# Patient Record
Sex: Female | Born: 1980 | Race: Black or African American | Hispanic: No | Marital: Married | Smoking: Never smoker
Health system: Southern US, Community
[De-identification: ages and names within clinical notes are randomized; demographics above are authoritative.]

## PROBLEM LIST (undated history)

## (undated) DIAGNOSIS — R87619 Unspecified abnormal cytological findings in specimens from cervix uteri: Secondary | ICD-10-CM

## (undated) HISTORY — DX: Unspecified abnormal cytological findings in specimens from cervix uteri: R87.619

---

## 2018-10-21 ENCOUNTER — Other Ambulatory Visit: Payer: Self-pay

## 2018-10-21 ENCOUNTER — Ambulatory Visit: Payer: Self-pay | Admitting: Physician Assistant

## 2018-10-21 VITALS — BP 111/69 | HR 78 | Temp 98.7°F | Resp 16 | Wt 213.2 lb

## 2018-10-21 DIAGNOSIS — H6691 Otitis media, unspecified, right ear: Secondary | ICD-10-CM

## 2018-10-21 DIAGNOSIS — J0100 Acute maxillary sinusitis, unspecified: Secondary | ICD-10-CM

## 2018-10-21 DIAGNOSIS — R0981 Nasal congestion: Secondary | ICD-10-CM

## 2018-10-21 MED ORDER — FLUTICASONE PROPIONATE 50 MCG/ACT NA SUSP: 2.0000 | Freq: Every day | NASAL | 0 refills | Status: AC

## 2018-10-21 MED ORDER — DOXYCYCLINE HYCLATE 100 MG PO TABS: 100.0000 mg | ORAL_TABLET | Freq: Two times a day (BID) | ORAL | 0 refills | Status: AC

## 2018-10-21 NOTE — Progress Notes (Signed)
Patient ID: Daisy Mcknight DOB: 08/30/1980 AGE: 38 y.o. MRN: [id number]   PCP: No primary care provider on file.   Chief Complaint:  Chief Complaint  Patient presents with  . Ear Pain    1 week, right side  . Eye Pain    1 week, right eye  . Sinus Problem    1 week, right side pressure     Subjective:    HPI:  Daisy Mcknight is a 38 y.o. female presents for evaluation  Chief Complaint  Patient presents with  . Ear Pain    1 week, right side  . Eye Pain    1 week, right eye  . Sinus Problem    1 week, right side pressure    38 year old female presents to [facility] with one week history of right ear pain, right eye pain, right sided facial pain, and right jaw pain. Began with postnasal drip and scratchy throat. Associated nasal congestion; has since resolved. Possible sweats/chills initially, have since resolved. Then developed right ear pain. Describes as a deep ache/pressure. Denies popping/crackling. Denies change in hearing. Denies ear discharge/drainage. No recent swimming. Associated right sided facial pain, tender to touch along right maxillary sinus, especially closer to nose. Associated right jaw pain, locates at right ramus. Has been using OTC zyrtec and ibuprofen with minimal improvement. Known seasonal allergies; worse in fall. Was evaluated by eye doctor (told no cause for eye pain, given new rx) and dentist (no cause for dental/jaw pain). Both professionals stated they suspected she may have sinusitis and need an antibiotic. Denies eye injury/trauma. Denies fever, headache, dizziness/lightheadedness, change in vision, eye discharge/drainage, epistaxis, sore throat, cough, chest pain, SOB, wheezing, nausea/vomiting. 57 year old son with similar symptoms. Denies known Covid19 exposure; declines testing for her or her son.  A limited review of symptoms was performed, pertinent positives and negatives as mentioned in HPI.  The following portions of the patient's  history were reviewed and updated as appropriate: allergies, current medications and past medical history.  There are no active problems to display for this patient.   Not on File  Current Outpatient Medications on File Prior to Visit  Medication Sig Dispense Refill  . cetirizine (ZYRTEC) 10 MG tablet Take 10 mg by mouth daily.     No current facility-administered medications on file prior to visit.        Objective:   Vitals:   10/21/18 1215  BP: 111/69  Pulse: 78  Resp: 16  Temp: 98.7 F (37.1 C)  SpO2: 96%     Wt Readings from Last 3 Encounters:  10/21/18 213 lb 3.2 oz (96.7 kg)    Physical Exam:   General Appearance:  Patient sitting comfortably on examination table. Conversational. Kermit Balo self-historian. In no acute distress. Afebrile.   Head:  Normocephalic, without obvious abnormality, atraumatic  Eyes:  PERRL, conjunctiva/corneas clear, EOM's intact Right conjunctiva with no injection or erythema. No discharge/drainage. No chemosis. No perilimbal flushing. Cornea normal in appearance. No eyelid erythema, edema or evidence of sty. No periorbital edema or erythema. No tenderness with palpation over eye or periorbital area.  Ears:  Left ear canal WNL. No erythema or edema. No open wound. No visible purulent drainage. No tenderness with palpation over left tragus or with manipulation of left auricle. No visible erythema or edema of left mastoid. No tenderness with palpation over left mastoid. Right ear canal WNL. No erythema or edema. No open wound. No visible purulent drainage. No tenderness with  palpation over right tragus or with manipulation of right auricle. No visible erythema or edema of right mastoid. No tenderness with palpation over right mastoid. Left TM WNL. Good light reflex. Visible landmarks. No erythema. No injection. No bulging or retraction. No visible perforation. No serous effusion. No visible purulent effusion. No tympanostomy tube. No scar  tissue. Right TM: Diffuse faint erythema. Minimal bulging; mild serous effusion, appears clear, no cloudy/purulent appearance. Minimal injection. No scar tissue. No visible aperture. No discharge/drainage.  Nose: Nares normal. Septum midline. No visible polyps. No discharge. Normal mucosa. No significant edema. No visible rhinorrhea/discharge. Moderate tenderness with palpation over right maxillary sinus, more severe adjacent to nose.  Throat: Lips, mucosa, and tongue normal; teeth and gums normal. Throat reveals no erythema. No postnasal drip. No visible cobblestoning. Tonsils with no enlargement or exudate. Uvula midline with no edema or erythema.  Neck: Supple, symmetrical, trachea midline, no adenopathy  Lungs:   Clear to auscultation bilaterally, respirations unlabored. Good aeration. No rales, rhonchi, crackles or wheezing.  Heart:  Regular rate and rhythm, S1 and S2 normal, no murmur, rub, or gallop  Extremities: Extremities normal, atraumatic, no cyanosis or edema  Pulses: 2+ and symmetric  Skin: Skin color, texture, turgor normal, no rashes or lesions  Lymph nodes: Cervical, supraclavicular, and axillary nodes normal  Neurologic: Normal    Assessment & Plan:    Exam findings, diagnosis etiology and medication use and indications reviewed with patient. Follow-Up and discharge instructions provided. No emergent/urgent issues found on exam.  Patient education was provided.   Patient verbalized understanding of information provided and agrees with plan of care (POC), all questions answered. The patient is advised to call or return to clinic if condition does not see an improvement in symptoms, or to seek the care of the closest emergency department if condition worsens with the below plan.    1. Acute non-recurrent maxillary sinusitis - doxycycline (VIBRA-TABS) 100 MG tablet; Take 1 tablet (100 mg total) by mouth 2 (two) times daily for 7 days.  Dispense: 14 tablet; Refill: 0 -  fluticasone (FLONASE) 50 MCG/ACT nasal spray; Place 2 sprays into both nostrils daily.  Dispense: 16 g; Refill: 0  2. Nasal congestion  3. Acute right otitis media  38 year old female presents with one week history of right ear pain. Associated right eye pain, right maxillary pain, and right jaw pain. Precipitated by allergy/URI symptoms. Evaluated by optometrist and dentist; no visible cause for symptoms. VSS, afebrile, in no acute distress, erythematous TM on physical exam. At this time, will treat for acute sinusitis and right otitis media. Prescribed Doxycycline 100mg  bid x 7 days and Flonase nasal spray. Advised saline nasal rinse and/or NettiPot. Advised Tylenol or ibuprofen for pain/discomfort. Advised follow-up with PCP, urgent care, or ED in 2-3 days if symptoms not improving. Advised sooner follow-up with worsening symptoms or any new/concerning symptoms. Patient agreed with plan.   Janalyn HarderSamantha Brinleigh Tew, MHS, PA-C Rulon SeraSamantha F. Jarika Robben, MHS, PA-C [facility]  [facility]  [phone] [place], [place] [address] (p): [phone] [email or web address]

## 2018-10-21 NOTE — Patient Instructions (Addendum)
Thank you for choosing [facility] for your health care needs.  You have been diagnosed with: 1. Acute non-recurrent maxillary sinusitis - doxycycline (VIBRA-TABS) 100 MG tablet; Take 1 tablet (100 mg total) by mouth 2 (two) times daily for 7 days.  Dispense: 14 tablet; Refill: 0 - fluticasone (FLONASE) 50 MCG/ACT nasal spray; Place 2 sprays into both nostrils daily.  Dispense: 16 g; Refill: 0  2. Nasal congestion  3. Acute right otitis media  Take medications as prescribed.  May continue to use over the counter Zyrtec (ceterizine).  Recommend using saline nasal spray and/or NettiPot. May use over the counter Tylenol or ibuprofen for pain/discomfort.  Follow-up with family physician, urgent care, or ED in 2-3 days if symptoms not improving. Follow-up sooner with any worsening symptoms such as fever, chills, body aches, severe headache, ear discharge/drainage, loss of hearing, nausea/vomiting, or other new/concerning symptom.  Hope you feel better soon!   Sinusitis, Adult Sinusitis is soreness and swelling (inflammation) of your sinuses. Sinuses are hollow spaces in the bones around your face. They are located:  Around your eyes.  In the middle of your forehead.  Behind your nose.  In your cheekbones. Your sinuses and nasal passages are lined with a fluid called mucus. Mucus drains out of your sinuses. Swelling can trap mucus in your sinuses. This lets germs (bacteria, virus, or fungus) grow, which leads to infection. Most of the time, this condition is caused by a virus. What are the causes? This condition is caused by:  Allergies.  Asthma.  Germs.  Things that block your nose or sinuses.  Growths in the nose (nasal polyps).  Chemicals or irritants in the air.  Fungus (rare). What increases the risk? You are more likely to develop this condition if:  You have a weak body defense system (immune system).  You do a lot of swimming or diving.  You use  nasal sprays too much.  You smoke. What are the signs or symptoms? The main symptoms of this condition are pain and a feeling of pressure around the sinuses. Other symptoms include:  Stuffy nose (congestion).  Runny nose (drainage).  Swelling and warmth in the sinuses.  Headache.  Toothache.  A cough that may get worse at night.  Mucus that collects in the throat or the back of the nose (postnasal drip).  Being unable to smell and taste.  Being very tired (fatigue).  A fever.  Sore throat.  Bad breath. How is this diagnosed? This condition is diagnosed based on:  Your symptoms.  Your medical history.  A physical exam.  Tests to find out if your condition is short-term (acute) or long-term (chronic). Your doctor may: ? Check your nose for growths (polyps). ? Check your sinuses using a tool that has a light (endoscope). ? Check for allergies or germs. ? Do imaging tests, such as an MRI or CT scan. How is this treated? Treatment for this condition depends on the cause and whether it is short-term or long-term.  If caused by a virus, your symptoms should go away on their own within 10 days. You may be given medicines to relieve symptoms. They include: ? Medicines that shrink swollen tissue in the nose. ? Medicines that treat allergies (antihistamines). ? A spray that treats swelling of the nostrils. ? Rinses that help get rid of thick mucus in your nose (nasal saline washes).  If caused by bacteria, your doctor may wait to see if you will get better without treatment.  You may be given antibiotic medicine if you have: ? A very bad infection. ? A weak body defense system.  If caused by growths in the nose, you may need to have surgery. Follow these instructions at home: Medicines  Take, use, or apply over-the-counter and prescription medicines only as told by your doctor. These may include nasal sprays.  If you were prescribed an antibiotic medicine, take it  as told by your doctor. Do not stop taking the antibiotic even if you start to feel better. Hydrate and humidify   Drink enough water to keep your pee (urine) pale yellow.  Use a cool mist humidifier to keep the humidity level in your home above 50%.  Breathe in steam for 10-15 minutes, 3-4 times a day, or as told by your doctor. You can do this in the bathroom while a hot shower is running.  Try not to spend time in cool or dry air. Rest  Rest as much as you can.  Sleep with your head raised (elevated).  Make sure you get enough sleep each night. General instructions   Put a warm, moist washcloth on your face 3-4 times a day, or as often as told by your doctor. This will help with discomfort.  Wash your hands often with soap and water. If there is no soap and water, use hand sanitizer.  Do not smoke. Avoid being around people who are smoking (secondhand smoke).  Keep all follow-up visits as told by your doctor. This is important. Contact a doctor if:  You have a fever.  Your symptoms get worse.  Your symptoms do not get better within 10 days. Get help right away if:  You have a very bad headache.  You cannot stop throwing up (vomiting).  You have very bad pain or swelling around your face or eyes.  You have trouble seeing.  You feel confused.  Your neck is stiff.  You have trouble breathing. Summary  Sinusitis is swelling of your sinuses. Sinuses are hollow spaces in the bones around your face.  This condition is caused by tissues in your nose that become inflamed or swollen. This traps germs. These can lead to infection.  If you were prescribed an antibiotic medicine, take it as told by your doctor. Do not stop taking it even if you start to feel better.  Keep all follow-up visits as told by your doctor. This is important. This information is not intended to replace advice given to you by your health care provider. Make sure you discuss any questions you  have with your health care provider. Document Released: 07/29/2007 Document Revised: 07/12/2017 Document Reviewed: 07/12/2017 Elsevier Patient Education  2020 [employer].

## 2018-11-01 ENCOUNTER — Encounter: Payer: Self-pay | Admitting: Obstetrics & Gynecology

## 2018-11-01 ENCOUNTER — Other Ambulatory Visit: Payer: Self-pay

## 2018-11-01 ENCOUNTER — Ambulatory Visit (INDEPENDENT_AMBULATORY_CARE_PROVIDER_SITE_OTHER): Payer: BC Managed Care – PPO | Admitting: Obstetrics & Gynecology

## 2018-11-01 VITALS — BP 128/83 | HR 76 | Ht 65.0 in | Wt 207.0 lb

## 2018-11-01 DIAGNOSIS — Z01419 Encounter for gynecological examination (general) (routine) without abnormal findings: Secondary | ICD-10-CM

## 2018-11-01 DIAGNOSIS — Z30431 Encounter for routine checking of intrauterine contraceptive device: Secondary | ICD-10-CM

## 2018-11-01 DIAGNOSIS — Z1151 Encounter for screening for human papillomavirus (HPV): Secondary | ICD-10-CM

## 2018-11-01 DIAGNOSIS — Z124 Encounter for screening for malignant neoplasm of cervix: Secondary | ICD-10-CM | POA: Diagnosis not present

## 2018-11-01 NOTE — Patient Instructions (Signed)
Thank you for enrolling in [facility]. Please follow the instructions below to securely access your online medical record. MyChart allows you to send messages to your doctor, view your test results, manage appointments, and more.   How Do I Sign Up? 1. In your Internet browser, go to [employer] and enter [email or web address]. 2. Click on the Sign Up Now link in the Sign In box. You will see the New Member Sign Up page. 3. Enter your MyChart Access Code exactly as it appears below. You will not need to use this code after you've completed the sign-up process. If you do not sign up before the expiration date, you must request a new code.  MyChart Access Code: [email or web address] Expires: 12/04/2018  2:32 PM  4. Enter your Social Security Number ([id number]) and Date of Birth (mm/dd/yyyy) as indicated and click Submit. You will be taken to the next sign-up page. 5. Create a MyChart ID. This will be your MyChart login ID and cannot be changed, so think of one that is secure and easy to remember. 6. Create a MyChart password. You can change your password at any time. 7. Enter your Password Reset Question and Answer. This can be used at a later time if you forget your password.  8. Enter your e-mail address. You will receive e-mail notification when new information is available in [place]. 9. Click Sign Up. You can now view your medical record.   Additional Information Remember, MyChart is NOT to be used for urgent needs. For medical emergencies, dial 911.   Preventive Care 39-21 Years Old, Female Preventive care refers to visits with your health care provider and lifestyle choices that can promote health and wellness. This includes:  A yearly physical exam. This may also be called an annual well check.  Regular dental visits and eye exams.  Immunizations.  Screening for certain conditions.  Healthy lifestyle choices, such as eating a healthy diet, getting regular exercise, not  using drugs or products that contain nicotine and tobacco, and limiting alcohol use. What can I expect for my preventive care visit? Physical exam Your health care provider will check your:  Height and weight. This may be used to calculate body mass index (BMI), which tells if you are at a healthy weight.  Heart rate and blood pressure.  Skin for abnormal spots. Counseling Your health care provider may ask you questions about your:  Alcohol, tobacco, and drug use.  Emotional well-being.  Home and relationship well-being.  Sexual activity.  Eating habits.  Work and work Statistician.  Method of birth control.  Menstrual cycle.  Pregnancy history. What immunizations do I need?  Influenza (flu) vaccine  This is recommended every year. Tetanus, diphtheria, and pertussis (Tdap) vaccine  You may need a Td booster every 10 years. Varicella (chickenpox) vaccine  You may need this if you have not been vaccinated. Human papillomavirus (HPV) vaccine  If recommended by your health care provider, you may need three doses over 6 months. Measles, mumps, and rubella (MMR) vaccine  You may need at least one dose of MMR. You may also need a second dose. Meningococcal conjugate (MenACWY) vaccine  One dose is recommended if you are age 39-21 years and a first-year college student living in a residence hall, or if you have one of several medical conditions. You may also need additional booster doses. Pneumococcal conjugate (PCV13) vaccine  You may need this if you have certain conditions and were not previously vaccinated. Pneumococcal polysaccharide (PPSV23) vaccine  You may need one or two doses if you smoke cigarettes or if you have certain conditions. Hepatitis A vaccine  You may need this if you have certain conditions or if you travel or work in places where you may be exposed to hepatitis A. Hepatitis B vaccine  You may need this if you have certain conditions or if you  travel or work in places where you may be exposed to hepatitis B. Haemophilus influenzae type b (Hib) vaccine  You may need this if you have certain conditions. You may receive vaccines as individual doses or as more than one vaccine together in one shot (combination vaccines). Talk with your health care provider about the risks and benefits of combination vaccines. What tests do I need?  Blood tests  Lipid and cholesterol levels. These may be checked every 5 years starting at age 10.  Hepatitis C test.  Hepatitis B test. Screening  Diabetes screening. This is done by checking your blood sugar (glucose) after you have not eaten for a while (fasting).  Sexually transmitted disease (STD) testing.  BRCA-related cancer screening. This may be done if you have a family history of breast, ovarian, tubal, or peritoneal cancers.  Pelvic exam and Pap test. This may be done every 3 years starting at age 12. Starting at age 61, this may be done every 5 years if you have a Pap test in combination with an HPV test. Talk with your health care provider about your test results, treatment options, and if necessary, the need for more tests. Follow these instructions at home: Eating and drinking   Eat a diet that includes fresh fruits and vegetables, whole grains, lean protein, and low-fat dairy.  Take vitamin and mineral supplements as recommended by your health care provider.  Do not drink alcohol if: ? Your health care provider tells you not to drink. ? You are pregnant, may be pregnant, or are planning to become pregnant.  If you drink alcohol: ? Limit how much you have to 0-1 drink a day. ? Be aware of how much alcohol is in your drink. In the [place] one drink equals one 12 oz bottle of beer (355 mL), one 5 oz glass of wine (148 mL), or one 1 oz glass of hard liquor (44 mL). Lifestyle  Take daily care of your teeth and gums.  Stay active. Exercise for at least 30 minutes on 5 or more days  each week.  Do not use any products that contain nicotine or tobacco, such as cigarettes, e-cigarettes, and chewing tobacco. If you need help quitting, ask your health care provider.  If you are sexually active, practice safe sex. Use a condom or other form of birth control (contraception) in order to prevent pregnancy and STIs (sexually transmitted infections). If you plan to become pregnant, see your health care provider for a preconception visit. What's next?  Visit your health care provider once a year for a well check visit.  Ask your health care provider how often you should have your eyes and teeth checked.  Stay up to date on all vaccines. This information is not intended to replace advice given to you by your health care provider. Make sure you discuss any questions you have with your health care provider. Document Released: 04/07/2001 Document Revised: 10/21/2017 Document Reviewed: 10/21/2017 Elsevier Patient Education  2020 [employer].

## 2018-11-01 NOTE — Progress Notes (Signed)
GYNECOLOGY ANNUAL PREVENTATIVE CARE ENCOUNTER NOTE  History:     Daisy Mcknight is a 38 y.o. [id number] female here to establish care and for a routine annual gynecologic exam. She recently came from [place].  Current complaints: none. Had Mirena placed after her last birth in 09/2013, wants to know if she can replace her IUD. Denies abnormal vaginal bleeding, discharge, pelvic pain, problems with intercourse or other gynecologic concerns.    Gynecologic History Patient's last menstrual period was 10/18/2018. Contraception: Mirena IUD in 09/2013 Last Pap: 2019. Results were: normal with negative HPV.  Had an abnormal pap in 2004 followed by cryotherapy and normal paps.   Obstetric History OB History  Gravida Para Term Preterm AB Living  3 3 3     3   SAB TAB Ectopic Multiple Live Births          2    # Outcome Date GA Lbr Len/2nd Weight Sex Delivery Anes PTL Lv  3 Term 08/24/13 2074w0d   Judie PetitM Vag-Spont  Y   2 Term 05/21/07 1329w0d   F Vag-Spont   LIV  1 Term 10/31/99 3074w0d   F Vag-Spont  N LIV    Past Medical History:  Diagnosis Date  . Abnormal Pap smear of cervix    had cryo    History reviewed. No pertinent surgical history.  Current Outpatient Medications on File Prior to Visit  Medication Sig Dispense Refill  . levonorgestrel (MIRENA) 20 MCG/24HR IUD 1 each by Intrauterine route once.    . cetirizine (ZYRTEC) 10 MG tablet Take 10 mg by mouth daily.    . fluticasone (FLONASE) 50 MCG/ACT nasal spray Place 2 sprays into both nostrils daily. 16 g 0   No current facility-administered medications on file prior to visit.     Not on File  Social History:  reports that she has never smoked. She has never used smokeless tobacco. She reports previous alcohol use. She reports previous drug use.  History reviewed. No pertinent family history.  The following portions of the patient's history were reviewed and updated as appropriate: allergies, current medications, past family history, past  medical history, past social history, past surgical history and problem list.  Review of Systems Pertinent items noted in HPI and remainder of comprehensive ROS otherwise negative.  Physical Exam:  BP 128/83   Pulse 76   Ht 5\' 5"  (1.651 m)   Wt 207 lb (93.9 kg)   LMP 10/18/2018   BMI 34.45 kg/m  CONSTITUTIONAL: Well-developed, well-nourished female in no acute distress.  HENT:  Normocephalic, atraumatic, External right and left ear normal. Oropharynx is clear and moist EYES: Conjunctivae and EOM are normal. Pupils are equal, round, and reactive to light. No scleral icterus.  NECK: Normal range of motion, supple, no masses.  Normal thyroid.  SKIN: Skin is warm and dry. No rash noted. Not diaphoretic. No erythema. No pallor. MUSCULOSKELETAL: Normal range of motion. No tenderness.  No cyanosis, clubbing, or edema.  2+ distal pulses. NEUROLOGIC: Alert and oriented to person, place, and time. Normal reflexes, muscle tone coordination. No cranial nerve deficit noted. PSYCHIATRIC: Normal mood and affect. Normal behavior. Normal judgment and thought content. CARDIOVASCULAR: Normal heart rate noted, regular rhythm RESPIRATORY: Clear to auscultation bilaterally. Effort and breath sounds normal, no problems with respiration noted. BREASTS: Symmetric in size. No masses, skin changes, nipple drainage, or lymphadenopathy. ABDOMEN: Soft, normal bowel sounds, no distention noted.  No tenderness, rebound or guarding.  PELVIC: Normal appearing external genitalia; normal appearing  vaginal mucosa and cervix. IUD strings visualized.  No abnormal discharge noted.  Pap smear obtained.  Normal uterine size, no other palpable masses, no uterine or adnexal tenderness.   Assessment and Plan:      1. Well woman exam with routine gynecological exam - CBC - TSH - Hemoglobin A1c - Lipid panel - Comprehensive metabolic panel - Cytology - PAP - VITAMIN D 25 Hydroxy (Vit-D Deficiency, Fractures) Will follow up  results of pap smear and manage accordingly.  Annual labs also checked today.   2. IUD check up Recommended keeping IUD in place for seven years, no need to remove today.  Patient agreed with this plan. No other issues.   Routine preventative health maintenance measures emphasized. Please refer to After Visit Summary for other counseling recommendations.      Verita Schneiders, MD, [place] [facility], [place]

## 2018-11-02 ENCOUNTER — Telehealth: Payer: Self-pay | Admitting: *Deleted

## 2018-11-02 LAB — COMPREHENSIVE METABOLIC PANEL
ALT: 13 IU/L (ref 0–32)
AST: 13 IU/L (ref 0–40)
Albumin/Globulin Ratio: 1.6 (ref 1.2–2.2)
Albumin: 4.2 g/dL (ref 3.8–4.8)
Alkaline Phosphatase: 56 IU/L (ref 39–117)
BUN/Creatinine Ratio: 13 (ref 9–23)
BUN: 11 mg/dL (ref 6–20)
Bilirubin Total: 0.2 mg/dL (ref 0.0–1.2)
CO2: 23 mmol/L (ref 20–29)
Calcium: 10 mg/dL (ref 8.7–10.2)
Chloride: 103 mmol/L (ref 96–106)
Creatinine, Ser: 0.84 mg/dL (ref 0.57–1.00)
GFR calc Af Amer: 102 mL/min/{1.73_m2} (ref >59)
GFR calc non Af Amer: 88 mL/min/{1.73_m2} (ref >59)
Globulin, Total: 2.7 g/dL (ref 1.5–4.5)
Glucose: 97 mg/dL (ref 65–99)
Potassium: 4.3 mmol/L (ref 3.5–5.2)
Sodium: 138 mmol/L (ref 134–144)
Total Protein: 6.9 g/dL (ref 6.0–8.5)

## 2018-11-02 LAB — HEMOGLOBIN A1C
Est. average glucose Bld gHb Est-mCnc: 117 mg/dL
Hgb A1c MFr Bld: 5.7 % — ABNORMAL HIGH (ref 4.8–5.6)

## 2018-11-02 LAB — CBC
Hematocrit: 38.7 % (ref 34.0–46.6)
Hemoglobin: 12.6 g/dL (ref 11.1–15.9)
MCH: 25.7 pg — ABNORMAL LOW (ref 26.6–33.0)
MCHC: 32.6 g/dL (ref 31.5–35.7)
MCV: 79 fL (ref 79–97)
Platelets: 367 10*3/uL (ref 150–450)
RBC: 4.91 x10E6/uL (ref 3.77–5.28)
RDW: 14.5 % (ref 11.7–15.4)
WBC: 9.5 10*3/uL (ref 3.4–10.8)

## 2018-11-02 LAB — LIPID PANEL
Chol/HDL Ratio: 3.6 ratio (ref 0.0–4.4)
Cholesterol, Total: 146 mg/dL (ref 100–199)
HDL: 41 mg/dL (ref >39)
LDL Chol Calc (NIH): 92 mg/dL (ref 0–99)
Triglycerides: 66 mg/dL (ref 0–149)
VLDL Cholesterol Cal: 13 mg/dL (ref 5–40)

## 2018-11-02 LAB — VITAMIN D 25 HYDROXY (VIT D DEFICIENCY, FRACTURES): Vit D, 25-Hydroxy: 29.9 ng/mL — ABNORMAL LOW (ref 30.0–100.0)

## 2018-11-02 LAB — CYTOLOGY - PAP
Diagnosis: NEGATIVE
HPV: NOT DETECTED

## 2018-11-02 LAB — TSH: TSH: 1.92 u[IU]/mL (ref 0.450–4.500)

## 2018-11-02 NOTE — Telephone Encounter (Signed)
-----   Message from Osborne Oman, MD sent at 11/02/2018 11:09 AM EDT ----- Please call patient to inform of of prediabetic state (needs to modify diet and exercise, lose weight). Also had low Vitamin D (needs to take OTC supplements). Other labs are normal/not concerning. Thank you.

## 2018-11-02 NOTE — Telephone Encounter (Signed)
Called pt and informed of lab results and recommendations. Pt verbalizes and understands

## 2018-11-03 ENCOUNTER — Encounter: Payer: Self-pay | Admitting: Radiology

## 2018-11-11 ENCOUNTER — Other Ambulatory Visit: Payer: Self-pay | Admitting: Physician Assistant

## 2018-11-11 DIAGNOSIS — J0100 Acute maxillary sinusitis, unspecified: Secondary | ICD-10-CM

## 2018-12-21 ENCOUNTER — Encounter: Payer: Self-pay | Admitting: Women's Health

## 2019-06-19 ENCOUNTER — Other Ambulatory Visit
Admission: RE | Admit: 2019-06-19 | Discharge: 2019-06-19 | Disposition: A | Discharge status: Home / Self Care | Payer: BC Managed Care – PPO | Source: Ambulatory Visit | Attending: Optometry | Admitting: Optometry

## 2019-06-19 ENCOUNTER — Other Ambulatory Visit: Payer: Self-pay

## 2019-06-19 DIAGNOSIS — H20023 Recurrent acute iridocyclitis, bilateral: Secondary | ICD-10-CM | POA: Diagnosis not present

## 2019-06-19 LAB — CBC WITH DIFFERENTIAL/PLATELET
Abs Immature Granulocytes: 0.03 10*3/uL (ref 0.00–0.07)
Basophils Absolute: 0.1 10*3/uL (ref 0.0–0.1)
Basophils Relative: 1 %
Eosinophils Absolute: 0.2 10*3/uL (ref 0.0–0.5)
Eosinophils Relative: 2 %
HCT: 38.4 % (ref 36.0–46.0)
Hemoglobin: 12.6 g/dL (ref 12.0–15.0)
Immature Granulocytes: 0 %
Lymphocytes Relative: 25 %
Lymphs Abs: 2.4 10*3/uL (ref 0.7–4.0)
MCH: 25.7 pg — ABNORMAL LOW (ref 26.0–34.0)
MCHC: 32.8 g/dL (ref 30.0–36.0)
MCV: 78.4 fL — ABNORMAL LOW (ref 80.0–100.0)
Monocytes Absolute: 0.5 10*3/uL (ref 0.1–1.0)
Monocytes Relative: 5 %
Neutro Abs: 6.3 10*3/uL (ref 1.7–7.7)
Neutrophils Relative %: 67 %
Platelets: 348 10*3/uL (ref 150–400)
RBC: 4.9 MIL/uL (ref 3.87–5.11)
RDW: 15 % (ref 11.5–15.5)
WBC: 9.4 10*3/uL (ref 4.0–10.5)
nRBC: 0 % (ref 0.0–0.2)

## 2019-06-19 LAB — C-REACTIVE PROTEIN: CRP: 0.6 mg/dL (ref <1.0)

## 2019-06-19 LAB — SEDIMENTATION RATE: Sed Rate: 31 mm/hr — ABNORMAL HIGH (ref 0–20)

## 2019-06-20 LAB — ANA: Anti Nuclear Antibody (ANA): NEGATIVE

## 2019-06-20 LAB — RHEUMATOID FACTOR: Rheumatoid fact SerPl-aCnc: <10 IU/mL (ref 0.0–13.9)

## 2019-06-20 LAB — RPR: RPR Ser Ql: NONREACTIVE

## 2019-06-20 LAB — ANGIOTENSIN CONVERTING ENZYME: Angiotensin-Converting Enzyme: 30 U/L (ref 14–82)

## 2019-06-21 LAB — FLUORESCENT TREPONEMAL AB(FTA)-IGG-BLD: Fluorescent Treponemal Ab, IgG: NONREACTIVE

## 2019-06-26 LAB — HLA B*5701: HLA B 5701: NEGATIVE

## 2019-06-29 LAB — HLA-B27 ANTIGEN: HLA-B27: NEGATIVE

## 2020-08-29 ENCOUNTER — Other Ambulatory Visit: Payer: Self-pay

## 2020-08-29 ENCOUNTER — Ambulatory Visit (INDEPENDENT_AMBULATORY_CARE_PROVIDER_SITE_OTHER): Payer: 59 | Admitting: *Deleted

## 2020-08-29 VITALS — BP 138/83 | HR 88

## 2020-08-29 DIAGNOSIS — R300 Dysuria: Secondary | ICD-10-CM | POA: Diagnosis not present

## 2020-08-29 LAB — POCT URINALYSIS DIPSTICK: Nitrite, UA: POSITIVE

## 2020-08-29 MED ORDER — SULFAMETHOXAZOLE-TRIMETHOPRIM 800-160 MG PO TABS: 1.0000 | ORAL_TABLET | Freq: Two times a day (BID) | ORAL | 1 refills | Status: DC

## 2020-08-29 MED ORDER — PHENAZOPYRIDINE HCL 200 MG PO TABS: 200.0000 mg | ORAL_TABLET | Freq: Three times a day (TID) | ORAL | 1 refills | Status: DC | PRN

## 2020-08-29 NOTE — Progress Notes (Signed)
Patient was assessed and managed by nursing staff during this encounter. I have reviewed the chart and agree with the documentation and plan.   Jonaven Hilgers, MD 08/29/2020 4:06 PM 

## 2020-08-29 NOTE — Progress Notes (Signed)
SUBJECTIVE: Daisy Mcknight is a 40 y.o. female who complains of urinary frequency, urgency and dysuria x few days, without flank pain, fever, chills, or abnormal vaginal discharge or bleeding.   OBJECTIVE: Appears well, in no apparent distress.  Vital signs are normal. Urine dipstick shows positive for RBC's, positive for nitrates, and positive for leukocytes.    ASSESSMENT: Dysuria  PLAN: Treatment per orders.  Call or return to clinic prn if these symptoms worsen or fail to improve as anticipated.

## 2020-08-30 ENCOUNTER — Other Ambulatory Visit: Payer: Self-pay

## 2020-08-30 ENCOUNTER — Telehealth: Payer: Self-pay

## 2020-08-30 DIAGNOSIS — R300 Dysuria: Secondary | ICD-10-CM

## 2020-08-30 MED ORDER — CIPROFLOXACIN HCL 500 MG PO TABS: 500.0000 mg | ORAL_TABLET | Freq: Two times a day (BID) | ORAL | 0 refills | Status: AC

## 2020-08-30 NOTE — Telephone Encounter (Signed)
Pt called stating she is having an allergic reaction to Bactrim. Pt c/o of itching and redness on arms. Pt advised to take benadryl, pt states she has already taken benadryl and is requesting new medication. Per Dr. Macon Large cipro 500mg  bid x5 days.  Pt advised if reaction symtpoms worsen, report to urgent care or ER.

## 2020-09-01 LAB — URINE CULTURE

## 2021-02-11 ENCOUNTER — Ambulatory Visit: Payer: 59 | Admitting: Obstetrics & Gynecology

## 2021-03-27 ENCOUNTER — Other Ambulatory Visit (HOSPITAL_COMMUNITY)
Admission: RE | Admit: 2021-03-27 | Discharge: 2021-03-27 | Disposition: A | Discharge status: Home / Self Care | Payer: No Typology Code available for payment source | Source: Ambulatory Visit | Attending: Obstetrics & Gynecology | Admitting: Obstetrics & Gynecology

## 2021-03-27 ENCOUNTER — Ambulatory Visit (INDEPENDENT_AMBULATORY_CARE_PROVIDER_SITE_OTHER): Payer: No Typology Code available for payment source | Admitting: Obstetrics and Gynecology

## 2021-03-27 ENCOUNTER — Encounter: Payer: Self-pay | Admitting: Obstetrics and Gynecology

## 2021-03-27 ENCOUNTER — Other Ambulatory Visit: Payer: Self-pay

## 2021-03-27 VITALS — BP 125/86 | HR 82 | Ht 65.0 in | Wt 205.0 lb

## 2021-03-27 DIAGNOSIS — Z30433 Encounter for removal and reinsertion of intrauterine contraceptive device: Secondary | ICD-10-CM | POA: Diagnosis not present

## 2021-03-27 DIAGNOSIS — Z01419 Encounter for gynecological examination (general) (routine) without abnormal findings: Secondary | ICD-10-CM | POA: Insufficient documentation

## 2021-03-27 HISTORY — PX: IUD INSERTION: OBO1003

## 2021-03-27 HISTORY — PX: IUD REMOVAL: OBO 1004

## 2021-03-27 MED ORDER — LEVONORGESTREL 20 MCG/DAY IU IUD
1.0000 | INTRAUTERINE_SYSTEM | Freq: Once | INTRAUTERINE | Status: AC
  Administered 2021-03-27: 1 via INTRAUTERINE

## 2021-03-27 NOTE — Progress Notes (Signed)
Obstetrics and Gynecology New Patient Evaluation  Appointment Date: 03/27/2021  OBGYN Clinic: [facility]   Primary Care Provider: Patient, No Pcp Per (Inactive)  Chief Complaint: New mirena  History of Present Illness: Daisy Mcknight is a 41 y.o. African-American [id number], seen for the above chief complaint. Her past medical history is significant for BMI 30s  Patient had Mirena placed in 2015 and uses it for birth control and it did help with periods but has had more consistent periods now.   Review of Systems: A comprehensive review of systems was negative.   As Per HPI otherwise negative  Past Medical History:  Past Medical History:  Diagnosis Date   Abnormal Pap smear of cervix    had cryo    Past Surgical History:  No past surgical history on file.  Past Obstetrical History:  OB History  Gravida Para Term Preterm AB Living  3 3 3     3   SAB IAB Ectopic Multiple Live Births          2    # Outcome Date GA Lbr Len/2nd Weight Sex Delivery Anes PTL Lv  3 Term 08/24/13 [redacted]w[redacted]d   [name]  Y   2 Term 05/21/07 [redacted]w[redacted]d   F Vag-Spont   LIV  1 Term 10/31/99 [redacted]w[redacted]d   F Vag-Spont  N LIV    Past Gynecological History: As per HPI. History of Pap Smear(s): Yes.   Last pap 2020, which was negative  Social History:  Social History   Socioeconomic History   Marital status: Married    Spouse name: Not on file   Number of children: Not on file   Years of education: Not on file   Highest education level: Not on file  Occupational History   Not on file  Tobacco Use   Smoking status: Never   Smokeless tobacco: Never  Substance and Sexual Activity   Alcohol use: Not Currently   Drug use: Not Currently   Sexual activity: Yes    Birth control/protection: I.U.D.  Other Topics Concern   Not on file  Social History Narrative   Not on file   Social Determinants of Health   Financial Resource Strain: Not on file  Food Insecurity: Not on file   Transportation Needs: Not on file  Physical Activity: Not on file  Stress: Not on file  Social Connections: Not on file  Intimate Partner Violence: Not on file    Family History:  She denies any female cancers, bleeding or blood clotting disorders.   Health Maintenance:  Mammogram(s): No  Medications We administered levonorgestrel. Current Outpatient Medications  Medication Sig Dispense Refill   cetirizine (ZYRTEC) 10 MG tablet Take 10 mg by mouth daily.     fluticasone (FLONASE) 50 MCG/ACT nasal spray Place 2 sprays into both nostrils daily. 16 g 0   levonorgestrel (MIRENA) 20 MCG/24HR IUD 1 each by Intrauterine route once.     No current facility-administered medications for this visit.    Allergies Penicillins   Physical Exam:  BP 125/86    Pulse 82    Ht 5\' 5"  (1.651 m)    Wt 205 lb (93 kg)    BMI 34.11 kg/m  Body mass index is 34.11 kg/m. General appearance: Well nourished, well developed female in no acute distress.  Neck:  Supple, normal appearance, and no thyromegaly  Cardiovascular: normal s1 and s2.  No murmurs, rubs or gallops. Respiratory:  Clear to auscultation bilateral. Normal respiratory effort  Abdomen: positive bowel sounds and no masses, hernias; diffusely non tender to palpation, non distended Breasts: breasts appear normal, no suspicious masses, no skin or nipple changes or axillary nodes, and normal exam. Neuro/Psych:  Normal mood and affect.  Skin:  Warm and dry.  Lymphatic:  No inguinal lymphadenopathy.   Pelvic exam: is not limited by body habitus EGBUS: within normal limits Vagina: within normal limits and with no blood or discharge in the vault Cervix: normal appearing cervix without tenderness, discharge or lesions. IUD strings Uterus:  nonenlarged and non tender Adnexa:  normal adnexa and no mass, fullness, tenderness Rectovaginal: deferred  See procedure note for Mirena removal and new Mirena insertion  Laboratory: none  Radiology:  none  Assessment: pt doing well  Plan:  1. Encounter for well woman exam Routine care. - Cytology - PAP - MM 3D SCREEN BREAST BILATERAL; Future  2. Encounter for removal and reinsertion of intrauterine contraceptive device (IUD)   Aletha Halim, Brooke Bonito MD Attending Center for [facility])

## 2021-03-27 NOTE — Procedures (Signed)
Intrauterine Device (IUD) Removal and Insertion Procedure Note  Mirena placed nearly 8 years ago and patient desires a new one.   Prior to the procedure being performed, the patient (or guardian) was asked to state their full name, date of birth, and the type of procedure being performed. EGBUS normal. Vaginal vault normal. Cervix normal with IUD strings seen (approx 3-4 cm in length). Strings grasped with ringed forceps and easily removed and noted to be intact. A bimanual exam showed the uterus to be midposition.  Next, the cervix and vagina were cleaned with an antiseptic solution, and the cervix was grasped with a tenaculum.  The uterus was sounded to 8 cm.  The Mirena was placed without difficulty in the usual fashion.  The strings were cut to 3-4 cm.  The tenaculum was removed and cervix was found to be hemostatic.    No complications, patient tolerated the procedure well.  Durene Romans MD Attending Center for [facility] [employer])

## 2021-03-28 LAB — CYTOLOGY - PAP
Comment: NEGATIVE
Diagnosis: NEGATIVE
High risk HPV: NEGATIVE

## 2021-04-15 ENCOUNTER — Ambulatory Visit: Payer: Self-pay | Admitting: Obstetrics & Gynecology

## 2021-04-16 ENCOUNTER — Other Ambulatory Visit: Payer: Self-pay | Admitting: Obstetrics and Gynecology

## 2021-04-16 DIAGNOSIS — Z1231 Encounter for screening mammogram for malignant neoplasm of breast: Secondary | ICD-10-CM

## 2021-04-18 ENCOUNTER — Ambulatory Visit
Admission: RE | Admit: 2021-04-18 | Discharge: 2021-04-18 | Disposition: A | Discharge status: Home / Self Care | Payer: No Typology Code available for payment source | Source: Ambulatory Visit

## 2021-04-18 DIAGNOSIS — Z1231 Encounter for screening mammogram for malignant neoplasm of breast: Secondary | ICD-10-CM

## 2021-04-21 ENCOUNTER — Other Ambulatory Visit: Payer: Self-pay | Admitting: Obstetrics and Gynecology

## 2021-04-21 DIAGNOSIS — R928 Other abnormal and inconclusive findings on diagnostic imaging of breast: Secondary | ICD-10-CM

## 2021-04-22 ENCOUNTER — Ambulatory Visit: Payer: Self-pay | Admitting: Obstetrics & Gynecology

## 2021-04-25 DIAGNOSIS — Z1231 Encounter for screening mammogram for malignant neoplasm of breast: Secondary | ICD-10-CM

## 2021-05-08 ENCOUNTER — Ambulatory Visit: Payer: No Typology Code available for payment source | Admitting: Obstetrics and Gynecology

## 2021-05-15 ENCOUNTER — Other Ambulatory Visit: Payer: No Typology Code available for payment source

## 2021-10-24 ENCOUNTER — Ambulatory Visit: Payer: BC Managed Care – PPO | Admitting: Family

## 2021-10-24 ENCOUNTER — Encounter: Payer: Self-pay | Admitting: Family

## 2021-10-24 VITALS — BP 118/66 | HR 82 | Temp 98.6°F | Resp 16 | Ht 64.5 in | Wt 209.1 lb

## 2021-10-24 DIAGNOSIS — R7303 Prediabetes: Secondary | ICD-10-CM | POA: Insufficient documentation

## 2021-10-24 DIAGNOSIS — Z0000 Encounter for general adult medical examination without abnormal findings: Secondary | ICD-10-CM | POA: Insufficient documentation

## 2021-10-24 DIAGNOSIS — E559 Vitamin D deficiency, unspecified: Secondary | ICD-10-CM | POA: Insufficient documentation

## 2021-10-24 DIAGNOSIS — J301 Allergic rhinitis due to pollen: Secondary | ICD-10-CM | POA: Diagnosis not present

## 2021-10-24 NOTE — Assessment & Plan Note (Signed)
Ordered vitamin d pending results.   

## 2021-10-24 NOTE — Patient Instructions (Signed)
  Welcome to our clinic, I am happy to have you as my new patient. I am excited to continue on this healthcare journey with you.  Stop by the lab prior to leaving today. I will notify you of your results once received.   Please keep in mind Any my chart messages you send have up to a three business day turnaround for a response.  Phone calls may take up to a one full business day turnaround for a  response.   If you need a medication refill I recommend you request it through the pharmacy as this is easiest for us rather than sending a message and or phone call.   Due to recent changes in healthcare laws, you may see results of your imaging and/or laboratory studies on MyChart before I have had a chance to review them.  I understand that in some cases there may be results that are confusing or concerning to you. Please understand that not all results are received at the same time and often I may need to interpret multiple results in order to provide you with the best plan of care or course of treatment. Therefore, I ask that you please give me 2 business days to thoroughly review all your results before contacting my office for clarification. Should we see a critical lab result, you will be contacted sooner.   It was a pleasure seeing you today! Please do not hesitate to reach out with any questions and or concerns.  Regards,   Leonette Tischer FNP-C  

## 2021-10-24 NOTE — Progress Notes (Signed)
New Patient Office Visit  Subjective:  Patient ID: Daisy Mcknight, female    DOB: [birth year]  Age: 41 y.o. MRN: [id number]  CC:  Chief Complaint  Patient presents with   Establish Care    HPI [name] is here to establish care as a new patient.  Prior provider was: Emelda Fear, who is GYN. She has not had pcp in over four years.  Pt is without acute concerns.   IUD placed in 03/2021  Abn pap, maybe 2004? Cryo tolerated well.  03/27/21, pap negative hpv negative   Dental exams: cleaning twice yearly to [facility]  Eye exam [facility], goes yearly. Up to date. Due in September.  mammogram: right breast with possible asymmetry on last exam 04/18/21. Her gyn did order diag mammo and u/s however pt didn't want to have this completed.   chronic concerns:  Prediabetes: moves around often, works on trying to keep diet in check.   Vitamin d def: not currently taking daily supplement.   Allergic rhinitis: taking daily flonase and also zyrtec.    Past Medical History:  Diagnosis Date   Abnormal Pap smear of cervix    had cryo    Past Surgical History:  Procedure Laterality Date   IUD INSERTION  03/27/2021   IUD REMOVAL  03/27/2021    Family History  Problem Relation Age of Onset   Hyperlipidemia Mother    Hypertension Father    Stroke Maternal Grandmother    Hypertension Paternal Grandmother    Diabetes Paternal Grandmother    Hypertension Paternal Grandfather    Breast cancer Cousin     Social History   Socioeconomic History   Marital status: Married    Spouse name: Not on file   Number of children: Not on file   Years of education: Not on file   Highest education level: Not on file  Occupational History   Occupation: Development worker, international aid hospitality    Comment: Solicitor  Tobacco Use   Smoking status: Never   Smokeless tobacco: Never  Vaping Use   Vaping Use: Never used  Substance and Sexual Activity   Alcohol use: Not Currently   Drug use:  Not Currently   Sexual activity: Yes    Partners: Male    Birth control/protection: I.U.D.  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     fluticasone (FLONASE) 50 MCG/ACT nasal spray Place 2 sprays into both nostrils daily. 16 g 0   levonorgestrel (MIRENA) 20 MCG/24HR IUD 1 each by Intrauterine route once.     No facility-administered medications prior to visit.    Allergies  Allergen Reactions   Penicillins Other (See Comments)    Muscle body cramping     ROS Review of Systems  Review of Systems  Respiratory:  Negative for shortness of breath.   Cardiovascular:  Negative for chest pain and palpitations.  Gastrointestinal:  Negative for constipation and diarrhea.  Genitourinary:  Negative for dysuria, frequency and urgency.  Musculoskeletal:  Negative for myalgias.  Psychiatric/Behavioral:  Negative for depression and suicidal ideas.   All other systems reviewed and are negative.    Objective:    Physical Exam Vitals reviewed.  Constitutional:      General: She is not in acute distress.    Appearance: Normal appearance. She is not ill-appearing or toxic-appearing.  HENT:     Right Ear: Tympanic membrane normal.     Left Ear: Tympanic membrane normal.     Mouth/Throat:     Mouth: Mucous membranes are moist.     Pharynx: No pharyngeal swelling.     Tonsils: No tonsillar exudate.  Eyes:     Extraocular Movements: Extraocular movements intact.      Conjunctiva/sclera: Conjunctivae normal.     Pupils: Pupils are equal, round, and reactive to light.  Neck:     Thyroid: No thyroid mass.  Cardiovascular:     Rate and Rhythm: Normal rate and regular rhythm.  Pulmonary:     Effort: Pulmonary effort is normal.     Breath sounds: Normal breath sounds.  Abdominal:     General: Abdomen is flat. Bowel sounds are normal.     Palpations: Abdomen is soft.  Musculoskeletal:        General: Normal range of motion.  Lymphadenopathy:     Cervical:     Right cervical: No superficial cervical adenopathy.    Left cervical: No superficial cervical adenopathy.  Skin:    General: Skin is warm.     Capillary Refill: Capillary refill takes less than 2 seconds.  Neurological:     General: No focal deficit present.     Mental Status: She is alert and oriented to person, place, and time.  Psychiatric:        Mood and Affect: Mood normal.        Behavior: Behavior normal.        Thought Content: Thought content normal.        Judgment: Judgment normal.       BP 118/66   Pulse 82   Temp 98.6 F (37 C)   Resp 16   Ht 5' 4.5" (1.638 m)   Wt 209 lb 2 oz (94.9 kg)   SpO2 98%   BMI 35.34 kg/m  Wt Readings from Last 3 Encounters:  10/24/21 209 lb 2 oz (94.9 kg)  03/27/21 205 lb (93 kg)  11/01/18 207 lb (93.9 kg)     There are no preventive care reminders to display for this patient.   There are no preventive care reminders to display for this patient.  Lab Results  Component Value Date   TSH 1.920 11/01/2018   Lab Results  Component Value Date   WBC 9.4 06/19/2019   HGB 12.6 06/19/2019   HCT 38.4 06/19/2019   MCV 78.4 (L) 06/19/2019   PLT 348 06/19/2019   Lab Results  Component Value Date   NA 138 11/01/2018   K 4.3 11/01/2018   CO2 23 11/01/2018   GLUCOSE 97 11/01/2018   BUN 11 11/01/2018   CREATININE 0.84 11/01/2018   BILITOT 0.2 11/01/2018   ALKPHOS 56 11/01/2018   AST 13 11/01/2018   ALT 13 11/01/2018   PROT 6.9  11/01/2018   ALBUMIN 4.2 11/01/2018   CALCIUM 10.0 11/01/2018   Lab Results  Component Value Date   CHOL 146 11/01/2018   Lab Results  Component  Value Date   HDL 41 11/01/2018   Lab Results  Component Value Date   LDLCALC 92 11/01/2018   Lab Results  Component Value Date   TRIG 66 11/01/2018   Lab Results  Component Value Date   CHOLHDL 3.6 11/01/2018   Lab Results  Component Value Date   HGBA1C 5.7 (H) 11/01/2018      Assessment & Plan:   Problem List Items Addressed This Visit       Respiratory   Non-seasonal allergic rhinitis due to pollen - Primary    Continue flonase and zyrtec         Other   Vitamin D deficiency    Ordered vitamin d pending results.        Relevant Orders   VITAMIN D 25 Hydroxy (Vit-D Deficiency, Fractures)   Prediabetes    Pt advised of the following: Work on a diabetic diet, try to incorporate exercise at least 20-30 a day for 3 days a week or more.        Relevant Orders   Hemoglobin A1c   Encounter for general adult medical examination without abnormal findings    Patient Counseling(The following topics were reviewed):  Preventative care handout given to pt  Health maintenance and immunizations reviewed. Please refer to Health maintenance section. Pt advised on safe sex, wearing seatbelts in car, and proper nutrition labwork ordered today for annual Dental health: Discussed importance of regular tooth brushing, flossing, and dental visits.        Relevant Orders   Comprehensive metabolic panel   CBC with Differential/Platelet    No orders of the defined types were placed in this encounter.   Follow-up: Return in about 1 year (around 10/25/2022) for annually or as needed .    Mort Sawyers, FNP

## 2021-10-24 NOTE — Assessment & Plan Note (Signed)
Patient Counseling(The following topics were reviewed): ? Preventative care handout given to pt  ?Health maintenance and immunizations reviewed. Please refer to Health maintenance section. ?Pt advised on safe sex, wearing seatbelts in car, and proper nutrition ?labwork ordered today for annual ?Dental health: Discussed importance of regular tooth brushing, flossing, and dental visits. ? ? ?

## 2021-10-24 NOTE — Assessment & Plan Note (Signed)
Continue flonase and zyrtec 

## 2021-10-24 NOTE — Assessment & Plan Note (Signed)
Pt advised of the following: Work on a diabetic diet, try to incorporate exercise at least 20-30 a day for 3 days a week or more.   

## 2021-10-25 ENCOUNTER — Other Ambulatory Visit: Payer: Self-pay | Admitting: Family

## 2021-10-25 DIAGNOSIS — D72829 Elevated white blood cell count, unspecified: Secondary | ICD-10-CM | POA: Insufficient documentation

## 2021-10-25 LAB — COMPREHENSIVE METABOLIC PANEL
AG Ratio: 1.6 (calc) (ref 1.0–2.5)
ALT: 10 U/L (ref 6–29)
AST: 10 U/L (ref 10–30)
Albumin: 4.2 g/dL (ref 3.6–5.1)
Alkaline phosphatase (APISO): 53 U/L (ref 31–125)
BUN: 10 mg/dL (ref 7–25)
CO2: 26 mmol/L (ref 20–32)
Calcium: 10.4 mg/dL — ABNORMAL HIGH (ref 8.6–10.2)
Chloride: 104 mmol/L (ref 98–110)
Creat: 0.8 mg/dL (ref 0.50–0.99)
Globulin: 2.7 g/dL (calc) (ref 1.9–3.7)
Glucose, Bld: 74 mg/dL (ref 65–99)
Potassium: 4.4 mmol/L (ref 3.5–5.3)
Sodium: 140 mmol/L (ref 135–146)
Total Bilirubin: 0.2 mg/dL (ref 0.2–1.2)
Total Protein: 6.9 g/dL (ref 6.1–8.1)

## 2021-10-25 LAB — HEMOGLOBIN A1C
Hgb A1c MFr Bld: 5.5 % of total Hgb (ref <5.7)
Mean Plasma Glucose: 111 mg/dL
eAG (mmol/L): 6.2 mmol/L

## 2021-10-25 LAB — CBC WITH DIFFERENTIAL/PLATELET
Absolute Monocytes: 718 cells/uL (ref 200–950)
Basophils Absolute: 106 cells/uL (ref 0–200)
Basophils Relative: 0.8 %
Eosinophils Absolute: 452 cells/uL (ref 15–500)
Eosinophils Relative: 3.4 %
HCT: 38 % (ref 35.0–45.0)
Hemoglobin: 12.5 g/dL (ref 11.7–15.5)
Lymphs Abs: 3165 cells/uL (ref 850–3900)
MCH: 26.2 pg — ABNORMAL LOW (ref 27.0–33.0)
MCHC: 32.9 g/dL (ref 32.0–36.0)
MCV: 79.7 fL — ABNORMAL LOW (ref 80.0–100.0)
MPV: 9.8 fL (ref 7.5–12.5)
Monocytes Relative: 5.4 %
Neutro Abs: 8858 cells/uL — ABNORMAL HIGH (ref 1500–7800)
Neutrophils Relative %: 66.6 %
Platelets: 369 10*3/uL (ref 140–400)
RBC: 4.77 10*6/uL (ref 3.80–5.10)
RDW: 14.5 % (ref 11.0–15.0)
Total Lymphocyte: 23.8 %
WBC: 13.3 10*3/uL — ABNORMAL HIGH (ref 3.8–10.8)

## 2021-10-25 LAB — VITAMIN D 25 HYDROXY (VIT D DEFICIENCY, FRACTURES): Vit D, 25-Hydroxy: 36 ng/mL (ref 30–100)

## 2021-10-25 NOTE — Progress Notes (Signed)
Calcium slightly elevated, is she taking any calcium daily and or Tums on a regular basis?  Also white blood cell a bit high, has she had any recent steroid injections? Has she recently been feeling sick and gotten over a cold recently?   Let's repeat CBC in one month in lab only appt.   Vitamin d improved.  No longer prediabetic.

## 2021-11-28 ENCOUNTER — Ambulatory Visit: Payer: BC Managed Care – PPO

## 2021-11-28 ENCOUNTER — Ambulatory Visit
Admission: RE | Admit: 2021-11-28 | Discharge: 2021-11-28 | Disposition: A | Discharge status: Home / Self Care | Payer: BC Managed Care – PPO | Source: Ambulatory Visit | Attending: Obstetrics and Gynecology | Admitting: Obstetrics and Gynecology

## 2021-11-28 DIAGNOSIS — R928 Other abnormal and inconclusive findings on diagnostic imaging of breast: Secondary | ICD-10-CM | POA: Diagnosis not present

## 2021-12-01 ENCOUNTER — Other Ambulatory Visit (INDEPENDENT_AMBULATORY_CARE_PROVIDER_SITE_OTHER): Payer: BC Managed Care – PPO

## 2021-12-01 DIAGNOSIS — D72829 Elevated white blood cell count, unspecified: Secondary | ICD-10-CM | POA: Diagnosis not present

## 2021-12-01 LAB — CBC WITH DIFFERENTIAL/PLATELET
Basophils Absolute: 0.1 10*3/uL (ref 0.0–0.1)
Basophils Relative: 0.8 % (ref 0.0–3.0)
Eosinophils Absolute: 0.3 10*3/uL (ref 0.0–0.7)
Eosinophils Relative: 3.8 % (ref 0.0–5.0)
HCT: 37.3 % (ref 36.0–46.0)
Hemoglobin: 12.3 g/dL (ref 12.0–15.0)
Lymphocytes Relative: 30.5 % (ref 12.0–46.0)
Lymphs Abs: 2.8 10*3/uL (ref 0.7–4.0)
MCHC: 33 g/dL (ref 30.0–36.0)
MCV: 79 fl (ref 78.0–100.0)
Monocytes Absolute: 0.5 10*3/uL (ref 0.1–1.0)
Monocytes Relative: 5.7 % (ref 3.0–12.0)
Neutro Abs: 5.5 10*3/uL (ref 1.4–7.7)
Neutrophils Relative %: 59.2 % (ref 43.0–77.0)
Platelets: 342 10*3/uL (ref 150.0–400.0)
RBC: 4.72 Mil/uL (ref 3.87–5.11)
RDW: 14.9 % (ref 11.5–15.5)
WBC: 9.3 10*3/uL (ref 4.0–10.5)

## 2021-12-02 LAB — PTH, INTACT AND CALCIUM
Calcium: 9.5 mg/dL (ref 8.6–10.2)
PTH: 75 pg/mL (ref 16–77)

## 2022-04-02 ENCOUNTER — Ambulatory Visit: Payer: BC Managed Care – PPO | Admitting: Family

## 2022-04-02 ENCOUNTER — Encounter: Payer: Self-pay | Admitting: Family

## 2022-04-02 VITALS — BP 120/82 | HR 82 | Temp 98.2°F | Ht 64.5 in | Wt 210.0 lb

## 2022-04-02 DIAGNOSIS — J301 Allergic rhinitis due to pollen: Secondary | ICD-10-CM

## 2022-04-02 DIAGNOSIS — Z6835 Body mass index (BMI) 35.0-35.9, adult: Secondary | ICD-10-CM

## 2022-04-02 DIAGNOSIS — E6609 Other obesity due to excess calories: Secondary | ICD-10-CM | POA: Insufficient documentation

## 2022-04-02 DIAGNOSIS — H9203 Otalgia, bilateral: Secondary | ICD-10-CM

## 2022-04-02 DIAGNOSIS — L501 Idiopathic urticaria: Secondary | ICD-10-CM

## 2022-04-02 DIAGNOSIS — Z1231 Encounter for screening mammogram for malignant neoplasm of breast: Secondary | ICD-10-CM

## 2022-04-02 MED ORDER — PREDNISONE 20 MG PO TABS: ORAL_TABLET | ORAL | 0 refills | Status: DC

## 2022-04-02 NOTE — Assessment & Plan Note (Signed)
Pt advised to continue to work on diet and exercise as tolerated Goal is for three meals a day with protein increase.  Recommended myfitness pal and or noom.

## 2022-04-02 NOTE — Assessment & Plan Note (Signed)
Continue xyzal  Recommendation for elimination diet to r/o triggers

## 2022-04-02 NOTE — Progress Notes (Signed)
Established Patient Office Visit  Subjective:   Patient ID: Daisy Mcknight, female    DOB: Mar 09, 1980  Age: 42 y.o. MRN: [id number]  CC:  Chief Complaint  Patient presents with   Ear Pain    Right ear with tenderness in throat x 1 mo.     HPI: Daisy Mcknight is a 42 y.o. female presenting on 04/02/2022 for Ear Pain (Right ear with tenderness in throat x 1 mo. )  HPI  Bil ear pain with pain, right > left. Also with some nasal congestion. This has been going on for one month. Has been taking zyrtec   Was sick about one month ago prior to this, thought maybe the flu. The ear has not stopped being in pain since then. No fever or chills. Also with some pnd. No sinus pressure.   Obesity: concern over weight . Has been trying to lose weight, exercise  Thinks might be because job is stressful. She has a walking pad at work that she does for 15 minutes daily, and will try to do about 15 with dumb bells at least three times a week.  Wt Readings from Last 3 Encounters:  04/02/22 210 lb (95.3 kg)  10/24/21 209 lb 2 oz (94.9 kg)  03/27/21 205 lb (93 kg)             ROS: Negative unless specifically indicated above in HPI.   Relevant past medical history reviewed and updated as indicated.   Allergies and medications reviewed and updated.   Current Outpatient Medications:    cetirizine (ZYRTEC) 10 MG tablet, Take 10 mg by mouth daily., Disp: , Rfl:    fluticasone (FLONASE) 50 MCG/ACT nasal spray, Place 2 sprays into both nostrils daily., Disp: 16 g, Rfl: 0   levonorgestrel (MIRENA) 20 MCG/24HR IUD, 1 each by Intrauterine route once., Disp: , Rfl:    predniSONE (DELTASONE) 20 MG tablet, Take two tablets once daily for five days, Disp: 10 tablet, Rfl: 0  Allergies  Allergen Reactions   Penicillins Other (See Comments)    Muscle body cramping     Objective:   BP 120/82   Pulse 82   Temp 98.2 F (36.8 C) (Oral)   Ht 5' 4.5" (1.638 m)   Wt 210 lb (95.3 kg)   SpO2 97%   BMI  35.49 kg/m    Physical Exam Constitutional:      General: She is not in acute distress.    Appearance: Normal appearance. She is obese. She is not ill-appearing, toxic-appearing or diaphoretic.  HENT:     Head: Normocephalic.     Right Ear: Hearing, tympanic membrane, ear canal and external ear normal.     Left Ear: Hearing, tympanic membrane, ear canal and external ear normal.     Nose: Nose normal.     Right Turbinates: Swollen and pale.     Left Turbinates: Swollen and pale.     Mouth/Throat:     Mouth: Mucous membranes are dry.     Pharynx: Posterior oropharyngeal erythema present. No oropharyngeal exudate.  Eyes:     General: Lids are normal.     Extraocular Movements: Extraocular movements intact.     Pupils: Pupils are equal, round, and reactive to light.  Cardiovascular:     Rate and Rhythm: Normal rate and regular rhythm.     Pulses: Normal pulses.     Heart sounds: Normal heart sounds.  Pulmonary:     Effort: Pulmonary effort is normal.  Breath sounds: Normal breath sounds.  Musculoskeletal:        General: Normal range of motion.     Cervical back: Normal range of motion.  Neurological:     General: No focal deficit present.     Mental Status: She is alert and oriented to person, place, and time. Mental status is at baseline.  Psychiatric:        Mood and Affect: Mood normal.        Behavior: Behavior normal.        Thought Content: Thought content normal.        Judgment: Judgment normal.     Assessment & Plan:  Screening mammogram for breast cancer -     3D Screening Mammogram, Left and Right; Future  Class 2 obesity due to excess calories without serious comorbidity with body mass index (BMI) of 35.0 to 35.9 in adult Assessment & Plan: Pt advised to continue to work on diet and exercise as tolerated Goal is for three meals a day with protein increase.  Recommended myfitness pal and or noom.    Acute otalgia, bilateral -     predniSONE; Take two  tablets once daily for five days  Dispense: 10 tablet; Refill: 0  Non-seasonal allergic rhinitis due to pollen Assessment & Plan: Recommendation for daily flonase and also stop zyrtec, start xyzal.  Rx prednisone 20 mg for bil otalgia   Chronic idiopathic urticaria Assessment & Plan: Continue xyzal  Recommendation for elimination diet to r/o triggers      Follow up plan: Return in about 1 year (around 04/03/2023) for f/u CPE.  Eugenia Pancoast, FNP

## 2022-04-02 NOTE — Assessment & Plan Note (Signed)
Recommendation for daily flonase and also stop zyrtec, start xyzal.  Rx prednisone 20 mg for bil otalgia

## 2022-04-02 NOTE — Patient Instructions (Addendum)
  ------------------------------------   Recommend daily flonase and also xyzal at night for allergies.  Stop zyrtec.   ------------------------------------  I have sent an electronic order over to your preferred location for the following:   []   2D Mammogram  [x]   3D Mammogram  []   Bone Density   Please give this center a call to get scheduled at your convenience.  [x]   [facility]  [address] [place] [address]  [phone]  Make sure to wear two piece  clothing  No lotions powders or deodorants the day of the appointment Make sure to bring picture ID and insurance card.  Bring list of medications you are currently taking including any supplements.    ------------------------------------   Some apps to look at our myfitnesspal and or the NOOM app.  Also look at [email or web address].[email or web address] if you prefer pre made meals that are delivered to your door step   ------------------------------------

## 2022-07-16 ENCOUNTER — Ambulatory Visit
Admission: RE | Admit: 2022-07-16 | Discharge: 2022-07-16 | Disposition: A | Discharge status: Home / Self Care | Payer: BC Managed Care – PPO | Source: Ambulatory Visit | Attending: Family | Admitting: Family

## 2022-07-16 DIAGNOSIS — Z1231 Encounter for screening mammogram for malignant neoplasm of breast: Secondary | ICD-10-CM | POA: Diagnosis not present

## 2022-07-20 NOTE — Progress Notes (Signed)
noted 

## 2022-11-04 ENCOUNTER — Ambulatory Visit: Payer: BC Managed Care – PPO | Admitting: Family

## 2022-11-04 ENCOUNTER — Encounter: Payer: Self-pay | Admitting: Family

## 2022-11-04 VITALS — BP 124/82 | HR 83 | Temp 97.7°F | Ht 64.5 in | Wt 211.0 lb

## 2022-11-04 DIAGNOSIS — R42 Dizziness and giddiness: Secondary | ICD-10-CM | POA: Insufficient documentation

## 2022-11-04 DIAGNOSIS — R110 Nausea: Secondary | ICD-10-CM | POA: Insufficient documentation

## 2022-11-04 DIAGNOSIS — H9202 Otalgia, left ear: Secondary | ICD-10-CM | POA: Insufficient documentation

## 2022-11-04 MED ORDER — MECLIZINE HCL 25 MG PO TABS: 25.0000 mg | ORAL_TABLET | Freq: Three times a day (TID) | ORAL | 0 refills | Status: AC | PRN

## 2022-11-04 MED ORDER — ONDANSETRON HCL 4 MG PO TABS: 4.0000 mg | ORAL_TABLET | Freq: Three times a day (TID) | ORAL | 0 refills | Status: AC | PRN

## 2022-11-04 MED ORDER — PREDNISONE 10 MG (21) PO TBPK: ORAL_TABLET | ORAL | 0 refills | Status: DC

## 2022-11-04 NOTE — Assessment & Plan Note (Signed)
Rx zofran 4 mg prn

## 2022-11-04 NOTE — Progress Notes (Signed)
Established Patient Office Visit  Subjective:   Patient ID: Daisy Mcknight, female    DOB: Dec 16, 1980  Age: 42 y.o. MRN: [id number]  CC:  Chief Complaint  Patient presents with   Acute Visit    Ear pain x1 week, has been having some dizziness since Sunday.    HPI: Daisy Mcknight is a 42 y.o. female presenting on 11/04/2022 for Acute Visit (Ear pain x1 week, has been having some dizziness since Sunday.)  Left sided ear pain x one week and then dizziness about four days ago. Describes it as a 'light spin' bad ot the point where she had to lie down and throw up. She took excedrin migraine without relief. Denies sore throat, nasal congestion and or sinus pressure. No fever or cough. When she feels this sensation she states the room is spinning, doesn't seem to matter position.        ROS: Negative unless specifically indicated above in HPI.   Relevant past medical history reviewed and updated as indicated.   Allergies and medications reviewed and updated.   Current Outpatient Medications:    fluticasone (FLONASE) 50 MCG/ACT nasal spray, Place 2 sprays into both nostrils daily., Disp: 16 g, Rfl: 0   levocetirizine (XYZAL) 5 MG tablet, Take 5 mg by mouth every evening., Disp: , Rfl:    levonorgestrel (MIRENA) 20 MCG/24HR IUD, 1 each by Intrauterine route once., Disp: , Rfl:    meclizine (ANTIVERT) 25 MG tablet, Take 1 tablet (25 mg total) by mouth 3 (three) times daily as needed for dizziness., Disp: 30 tablet, Rfl: 0   ondansetron (ZOFRAN) 4 MG tablet, Take 1 tablet (4 mg total) by mouth every 8 (eight) hours as needed for nausea or vomiting., Disp: 20 tablet, Rfl: 0   predniSONE (STERAPRED UNI-PAK 21 TAB) 10 MG (21) TBPK tablet, Take as directed, Disp: 1 each, Rfl: 0  Allergies  Allergen Reactions   Pyridium [Phenazopyridine] Other (See Comments)    'burned her skin'   Penicillins Other (See Comments)    Muscle body cramping     Objective:   BP 124/82 (BP Location: Right Arm,  Patient Position: Sitting, Cuff Size: Normal)   Pulse 83   Temp 97.7 F (36.5 C) (Temporal)   Ht 5' 4.5" (1.638 m)   Wt 211 lb (95.7 kg)   SpO2 98%   BMI 35.66 kg/m    Physical Exam Constitutional:      General: She is not in acute distress.    Appearance: Normal appearance. She is normal weight. She is not ill-appearing, toxic-appearing or diaphoretic.  HENT:     Head: Normocephalic.     Right Ear: A middle ear effusion is present. Tympanic membrane is not erythematous or bulging.     Left Ear: A middle ear effusion is present. Tympanic membrane is not erythematous or bulging.     Nose: Nose normal.     Right Turbinates: Swollen.     Mouth/Throat:     Mouth: Mucous membranes are dry.     Pharynx: No oropharyngeal exudate or posterior oropharyngeal erythema.  Eyes:     Extraocular Movements: Extraocular movements intact.     Pupils: Pupils are equal, round, and reactive to light.  Cardiovascular:     Rate and Rhythm: Normal rate and regular rhythm.     Pulses: Normal pulses.     Heart sounds: Normal heart sounds.  Pulmonary:     Effort: Pulmonary effort is normal.     Breath sounds: Normal  breath sounds.  Musculoskeletal:     Cervical back: Normal range of motion.  Neurological:     General: No focal deficit present.     Mental Status: She is alert and oriented to person, place, and time. Mental status is at baseline.  Psychiatric:        Mood and Affect: Mood normal.        Behavior: Behavior normal.        Thought Content: Thought content normal.        Judgment: Judgment normal.     Assessment & Plan:  Vertigo Assessment & Plan: Prednisone pack rx sent in to medication  Meclizine 25 mg as needed.  Rise slowly    Orders: -     Meclizine HCl; Take 1 tablet (25 mg total) by mouth 3 (three) times daily as needed for dizziness.  Dispense: 30 tablet; Refill: 0 -     predniSONE; Take as directed  Dispense: 1 each; Refill: 0  Nausea Assessment & Plan: Rx zofran  4 mg prn    Orders: -     Ondansetron HCl; Take 1 tablet (4 mg total) by mouth every 8 (eight) hours as needed for nausea or vomiting.  Dispense: 20 tablet; Refill: 0  Acute otalgia, left -     predniSONE; Take as directed  Dispense: 1 each; Refill: 0     Follow up plan: Return if symptoms worsen or fail to improve.  Mort Sawyers, FNP

## 2022-11-04 NOTE — Assessment & Plan Note (Signed)
Prednisone pack rx sent in to medication  Meclizine 25 mg as needed.  Rise slowly

## 2022-12-24 ENCOUNTER — Encounter: Payer: Self-pay | Admitting: Family

## 2022-12-24 ENCOUNTER — Telehealth: Payer: BC Managed Care – PPO | Admitting: Family

## 2022-12-24 DIAGNOSIS — J40 Bronchitis, not specified as acute or chronic: Secondary | ICD-10-CM | POA: Insufficient documentation

## 2022-12-24 MED ORDER — PREDNISONE 10 MG (21) PO TBPK: ORAL_TABLET | ORAL | 0 refills | Status: DC

## 2022-12-24 MED ORDER — AZITHROMYCIN 250 MG PO TABS: ORAL_TABLET | ORAL | 0 refills | Status: AC

## 2022-12-24 NOTE — Assessment & Plan Note (Signed)
Rx prednisone pack  Rx zpack, to use as a pocket prescription if no improvement x 2-3 days as pt also leaving town this weekend to go on a cruise.  Pt advised to:  Increase oral fluids. Pt to f/u if sx worsen and or fail to improve in 2-3 days.

## 2022-12-24 NOTE — Progress Notes (Signed)
Virtual Visit via Video note  I connected with Daisy Mcknight on 12/24/22 at home by video and verified that I am speaking with the correct person using two identifiers.The provider, Mort Sawyers, FNP is located in their home at time of visit.  I discussed the limitations, risks, security and privacy concerns of performing an evaluation and management service by video and the availability of in person appointments. I also discussed with the patient that there may be a patient responsible charge related to this service. The patient expressed understanding and agreed to proceed.  Subjective: PCP: Mort Sawyers, FNP  Chief Complaint  Patient presents with   Cough    Dry cough x2 weeks. Has been taking Mucinex, drinking hot tea with honey. Denies congestion, fever, sore throat.    HPI  Over the last two weeks with ongoing dry cough, relentless to the point where she can hardly talk without coughing. She denies any other symptoms to include sore throat, ear pain, fever, chills and or nasal congestion. She denies sob or wheezing.   She has tried otc mucinex, using hot tea, taking flonase and zyrtec without improvement.   Did recently come back from a trip to [place]      ROS: Per HPI  Current Outpatient Medications:    azithromycin (ZITHROMAX) 250 MG tablet, Take 2 tablets on day 1, then 1 tablet daily on days 2 through 5, Disp: 6 tablet, Rfl: 0   fluticasone (FLONASE) 50 MCG/ACT nasal spray, Place 2 sprays into both nostrils daily., Disp: 16 g, Rfl: 0   levocetirizine (XYZAL) 5 MG tablet, Take 5 mg by mouth every evening., Disp: , Rfl:    levonorgestrel (MIRENA) 20 MCG/24HR IUD, 1 each by Intrauterine route once., Disp: , Rfl:    meclizine (ANTIVERT) 25 MG tablet, Take 1 tablet (25 mg total) by mouth 3 (three) times daily as needed for dizziness., Disp: 30 tablet, Rfl: 0   ondansetron (ZOFRAN) 4 MG tablet, Take 1 tablet (4 mg total) by mouth every 8 (eight) hours as needed  for nausea or vomiting., Disp: 20 tablet, Rfl: 0   predniSONE (STERAPRED UNI-PAK 21 TAB) 10 MG (21) TBPK tablet, Take as directed, Disp: 1 each, Rfl: 0  Observations/Objective: Physical Exam Constitutional:      General: She is not in acute distress.    Appearance: Normal appearance. She is not ill-appearing.  Pulmonary:     Effort: Pulmonary effort is normal.  Neurological:     General: No focal deficit present.     Mental Status: She is alert and oriented to person, place, and time.  Psychiatric:        Mood and Affect: Mood normal.        Behavior: Behavior normal.        Thought Content: Thought content normal.     Assessment and Plan: Bronchitis Assessment & Plan: Rx prednisone pack  Rx zpack, to use as a pocket prescription if no improvement x 2-3 days as pt also leaving town this weekend to go on a cruise.  Pt advised to:  Increase oral fluids. Pt to f/u if sx worsen and or fail to improve in 2-3 days.   Orders: -     predniSONE; Take as directed  Dispense: 1 each; Refill: 0 -     Azithromycin; Take 2 tablets on day 1, then 1 tablet daily on days 2 through 5  Dispense: 6 tablet; Refill: 0    Follow Up Instructions: Return if symptoms  worsen or fail to improve.   I discussed the assessment and treatment plan with the patient. The patient was provided an opportunity to ask questions and all were answered. The patient agreed with the plan and demonstrated an understanding of the instructions.   The patient was advised to call back or seek an in-person evaluation if the symptoms worsen or if the condition fails to improve as anticipated.  The above assessment and management plan was discussed with the patient. The patient verbalized understanding of and has agreed to the management plan. Patient is aware to call the clinic if symptoms persist or worsen. Patient is aware when to return to the clinic for a follow-up visit. Patient educated on when it is appropriate to go to  the emergency department.     Mort Sawyers, MSN, APRN, FNP-C [facility]

## 2023-01-12 ENCOUNTER — Ambulatory Visit: Payer: BC Managed Care – PPO | Admitting: Family

## 2023-01-12 ENCOUNTER — Encounter: Payer: Self-pay | Admitting: Family

## 2023-01-12 VITALS — BP 122/86 | HR 84 | Temp 97.9°F | Ht 64.5 in | Wt 216.0 lb

## 2023-01-12 DIAGNOSIS — J029 Acute pharyngitis, unspecified: Secondary | ICD-10-CM | POA: Diagnosis not present

## 2023-01-12 DIAGNOSIS — J0110 Acute frontal sinusitis, unspecified: Secondary | ICD-10-CM

## 2023-01-12 LAB — POCT RAPID STREP A (OFFICE): Rapid Strep A Screen: NEGATIVE

## 2023-01-12 MED ORDER — LEVOFLOXACIN 500 MG PO TABS: 500.0000 mg | ORAL_TABLET | Freq: Every day | ORAL | 0 refills | Status: AC

## 2023-01-12 NOTE — Progress Notes (Unsigned)
Established Patient Office Visit  Subjective:   Patient ID: Daisy Mcknight, female    DOB: 1980-12-03  Age: 42 y.o. MRN: [id number]  CC:  Chief Complaint  Patient presents with  . Acute Visit    Reports dry cough, sore throat and R ear pain x1 month.    HPI: Daisy Mcknight is a 42 y.o. female presenting on 01/12/2023 for Acute Visit (Reports dry cough, sore throat and R ear pain x1 month.)  Right ear pain, sore throat, and right sided neck tenderness. She also has a headache on top of this which has been on and off since about ten days ago. Taking two zyrtec and nasal spray with only very mild relief. No fever. Cough has decreased.   She was seen 10/31 video visit for ongoing dry cough, was given zpack and prednisone and did have some improvement, cough is no longer as often.        ROS: Negative unless specifically indicated above in HPI.   Relevant past medical history reviewed and updated as indicated.   Allergies and medications reviewed and updated.   Current Outpatient Medications:  .  levofloxacin (LEVAQUIN) 500 MG tablet, Take 1 tablet (500 mg total) by mouth daily for 7 days., Disp: 7 tablet, Rfl: 0 .  fluticasone (FLONASE) 50 MCG/ACT nasal spray, Place 2 sprays into both nostrils daily., Disp: 16 g, Rfl: 0 .  levocetirizine (XYZAL) 5 MG tablet, Take 5 mg by mouth every evening., Disp: , Rfl:  .  levonorgestrel (MIRENA) 20 MCG/24HR IUD, 1 each by Intrauterine route once., Disp: , Rfl:  .  meclizine (ANTIVERT) 25 MG tablet, Take 1 tablet (25 mg total) by mouth 3 (three) times daily as needed for dizziness., Disp: 30 tablet, Rfl: 0 .  ondansetron (ZOFRAN) 4 MG tablet, Take 1 tablet (4 mg total) by mouth every 8 (eight) hours as needed for nausea or vomiting., Disp: 20 tablet, Rfl: 0  Allergies  Allergen Reactions  . Pyridium [Phenazopyridine] Other (See Comments)    'burned her skin'  . Penicillins Other (See Comments)    Muscle body cramping     Objective:   BP  122/86 (BP Location: Left Arm, Patient Position: Sitting, Cuff Size: Normal)   Pulse 84   Temp 97.9 F (36.6 C) (Temporal)   Ht 5' 4.5" (1.638 m)   Wt 216 lb (98 kg)   SpO2 99%   BMI 36.50 kg/m    Physical Exam Constitutional:      General: She is not in acute distress.    Appearance: Normal appearance. She is normal weight. She is not ill-appearing, toxic-appearing or diaphoretic.  HENT:     Head: Normocephalic.     Right Ear: Tympanic membrane normal.     Left Ear: Tympanic membrane normal.     Nose:     Right Turbinates: Enlarged and swollen.     Left Turbinates: Enlarged.     Right Sinus: Frontal sinus tenderness present.     Left Sinus: Frontal sinus tenderness present.     Mouth/Throat:     Mouth: Mucous membranes are dry.     Pharynx: No oropharyngeal exudate or posterior oropharyngeal erythema.  Eyes:     Extraocular Movements: Extraocular movements intact.     Pupils: Pupils are equal, round, and reactive to light.  Cardiovascular:     Rate and Rhythm: Normal rate and regular rhythm.     Pulses: Normal pulses.     Heart sounds: Normal heart sounds.  Pulmonary:     Effort: Pulmonary effort is normal.     Breath sounds: Normal breath sounds.  Musculoskeletal:     Cervical back: Normal range of motion.  Lymphadenopathy:     Cervical: Cervical adenopathy present.     Right cervical: Superficial cervical adenopathy and deep cervical adenopathy present.  Neurological:     General: No focal deficit present.     Mental Status: She is alert and oriented to person, place, and time. Mental status is at baseline.  Psychiatric:        Mood and Affect: Mood normal.        Behavior: Behavior normal.        Thought Content: Thought content normal.        Judgment: Judgment normal.    Assessment & Plan:  Sore throat -     POCT rapid strep A  Acute non-recurrent frontal sinusitis -     levoFLOXacin; Take 1 tablet (500 mg total) by mouth daily for 7 days.  Dispense: 7  tablet; Refill: 0     Follow up plan: No follow-ups on file.  Mort Sawyers, FNP

## 2023-01-14 DIAGNOSIS — J0110 Acute frontal sinusitis, unspecified: Secondary | ICD-10-CM | POA: Insufficient documentation

## 2023-01-14 NOTE — Assessment & Plan Note (Signed)
Treatment failure zpack symptoms worsening Rx levaquin 500 mg po every day x 7 days  Strep negative Take antibiotic as prescribed. Increase oral fluids. Pt to f/u if sx worsen and or fail to improve in 2-3 days.

## 2023-01-20 ENCOUNTER — Ambulatory Visit
Admission: RE | Admit: 2023-01-20 | Discharge: 2023-01-20 | Disposition: A | Discharge status: Home / Self Care | Payer: BC Managed Care – PPO | Source: Ambulatory Visit | Attending: Family | Admitting: Family

## 2023-01-20 ENCOUNTER — Ambulatory Visit: Payer: BC Managed Care – PPO | Admitting: Family

## 2023-01-20 VITALS — BP 122/82 | HR 78 | Temp 97.9°F | Ht 65.0 in | Wt 218.6 lb

## 2023-01-20 DIAGNOSIS — J301 Allergic rhinitis due to pollen: Secondary | ICD-10-CM

## 2023-01-20 DIAGNOSIS — K118 Other diseases of salivary glands: Secondary | ICD-10-CM | POA: Diagnosis not present

## 2023-01-20 DIAGNOSIS — J3489 Other specified disorders of nose and nasal sinuses: Secondary | ICD-10-CM | POA: Diagnosis not present

## 2023-01-20 MED ORDER — SODIUM CHLORIDE 0.9 % IV SOLN: INTRAVENOUS | Status: DC

## 2023-01-20 MED ORDER — METRONIDAZOLE 500 MG PO TABS: 500.0000 mg | ORAL_TABLET | Freq: Two times a day (BID) | ORAL | 0 refills | Status: AC

## 2023-01-20 MED ORDER — IOHEXOL 300 MG/ML  SOLN
75.0000 mL | Freq: Once | INTRAMUSCULAR | Status: AC | PRN
  Administered 2023-01-20: 75 mL via INTRAVENOUS

## 2023-01-20 NOTE — Assessment & Plan Note (Addendum)
Restart flonase Avoid antihistamine for now with current symptoms to avoid worsening dryness

## 2023-01-20 NOTE — Assessment & Plan Note (Signed)
Treatment failure zpack, levaquin and prednisone.  Worsening.  Worry for ddx parotitis vs sialadenitis Red flag symptoms discussed with pt for ER if needed Rx metronidazole 500 mg po bid x 7 days.  Referral placed for ENT Ordering Ct neck soft tissue stat

## 2023-01-20 NOTE — Progress Notes (Signed)
Established Patient Office Visit  Subjective:   Patient ID: Daisy Mcknight, female    DOB: [birth year]  Age: 42 y.o. MRN: [id number]  CC:  Chief Complaint  Patient presents with   Follow-up    Still having issues with sore throat and R ear pain. This has been going on for over a month.    HPI: Daisy Mcknight is a 42 y.o. female presenting on 01/20/2023 for Follow-up (Still having issues with sore throat and R ear pain. This has been going on for over a month.)  Frontal sinusitis: seen 11/19 , treatment failure zpack and prednisone given rx levaquin 500 mg every day for seven days. She states it did absolutely nothing, has had to take ibuprofen because her face has been hurting so bad that it is waking her up at night. Constantly coughing up from PND. Coughing when lying on side or her back. Lying on right side of face wakes her up because of pain. No issues swallowing, no issues with saliva production. Still with right ear pain, sharp and stabbing and will throb, worse with turning head and or leaning over. Has been placing cotton swabs in her ear to numb the pain. Ibuprofen does dull the pain for a short period of time. No fever.  Not worse with eating or moving jaw.   Was taking flonase and zyrtec however stopped when she started levaquin.         ROS: Negative unless specifically indicated above in HPI.   Relevant past medical history reviewed and updated as indicated.   Allergies and medications reviewed and updated.   Current Outpatient Medications:    fluticasone (FLONASE) 50 MCG/ACT nasal spray, Mcknight 2 sprays into both nostrils daily., Disp: 16 g, Rfl: 0   levocetirizine (XYZAL) 5 MG tablet, Take 5 mg by mouth every evening., Disp: , Rfl:    levonorgestrel (MIRENA) 20 MCG/24HR IUD, 1 each by Intrauterine route once., Disp: , Rfl:    meclizine (ANTIVERT) 25 MG tablet, Take 1 tablet (25 mg total) by mouth 3 (three) times daily as needed for dizziness., Disp: 30 tablet, Rfl:  0   metroNIDAZOLE (FLAGYL) 500 MG tablet, Take 1 tablet (500 mg total) by mouth 2 (two) times daily for 7 days., Disp: 14 tablet, Rfl: 0   ondansetron (ZOFRAN) 4 MG tablet, Take 1 tablet (4 mg total) by mouth every 8 (eight) hours as needed for nausea or vomiting., Disp: 20 tablet, Rfl: 0  Allergies  Allergen Reactions   Pyridium [Phenazopyridine] Other (See Comments)    'burned her skin'   Penicillins Other (See Comments)    Muscle body cramping     Objective:   BP 122/82   Pulse 78   Temp 97.9 F (36.6 C) (Temporal)   Ht 5\' 5"  (1.651 m)   Wt 218 lb 9.6 oz (99.2 kg)   SpO2 98%   BMI 36.38 kg/m    Physical Exam Constitutional:      General: She is not in acute distress.    Appearance: Normal appearance. She is normal weight. She is not ill-appearing, toxic-appearing or diaphoretic.  HENT:     Head: Normocephalic.     Right Ear: Tympanic membrane normal.     Left Ear: Tympanic membrane normal.     Nose: Nose normal.     Mouth/Throat:     Mouth: Mucous membranes are dry.     Pharynx: No oropharyngeal exudate or posterior oropharyngeal erythema.  Eyes:     Extraocular Movements:  Extraocular movements intact.     Pupils: Pupils are equal, round, and reactive to light.  Cardiovascular:     Rate and Rhythm: Normal rate and regular rhythm.     Pulses: Normal pulses.     Heart sounds: Normal heart sounds.  Pulmonary:     Effort: Pulmonary effort is normal.     Breath sounds: Normal breath sounds.  Musculoskeletal:     Cervical back: Normal range of motion.  Lymphadenopathy:     Comments: Parotid and submandibular tenderness with palpable mass, non pulsatile  Tenderness on palpation of salivary gland right side beneath tongue  Neurological:     General: No focal deficit present.     Mental Status: She is alert and oriented to person, Mcknight, and time. Mental status is at baseline.  Psychiatric:        Mood and Affect: Mood normal.        Behavior: Behavior normal.         Thought Content: Thought content normal.        Judgment: Judgment normal.     Assessment & Plan:  Parotid gland pain Assessment & Plan: Treatment failure zpack, levaquin and prednisone.  Worsening.  Worry for ddx parotitis vs sialadenitis Red flag symptoms discussed with pt for ER if needed Rx metronidazole 500 mg po bid x 7 days.  Referral placed for ENT Ordering Ct neck soft tissue stat  Orders: -     CT SOFT TISSUE NECK W CONTRAST; Future -     metroNIDAZOLE; Take 1 tablet (500 mg total) by mouth 2 (two) times daily for 7 days.  Dispense: 14 tablet; Refill: 0 -     Ambulatory referral to ENT  Non-seasonal allergic rhinitis due to pollen Assessment & Plan: Restart flonase Avoid antihistamine for now with current symptoms to avoid worsening dryness       Follow up plan: No follow-ups on file.  Daisy Sawyers, FNP

## 2023-01-26 ENCOUNTER — Encounter (INDEPENDENT_AMBULATORY_CARE_PROVIDER_SITE_OTHER): Payer: Self-pay | Admitting: Otolaryngology

## 2023-02-03 ENCOUNTER — Encounter (INDEPENDENT_AMBULATORY_CARE_PROVIDER_SITE_OTHER): Payer: Self-pay | Admitting: Otolaryngology

## 2023-03-28 ENCOUNTER — Encounter: Payer: Self-pay | Admitting: Family

## 2023-07-12 ENCOUNTER — Other Ambulatory Visit: Payer: Self-pay | Admitting: Family

## 2023-07-12 DIAGNOSIS — Z1231 Encounter for screening mammogram for malignant neoplasm of breast: Secondary | ICD-10-CM

## 2023-07-21 ENCOUNTER — Ambulatory Visit
Admission: RE | Admit: 2023-07-21 | Discharge: 2023-07-21 | Disposition: A | Discharge status: Home / Self Care | Source: Ambulatory Visit | Attending: Family | Admitting: Family

## 2023-07-21 DIAGNOSIS — Z1231 Encounter for screening mammogram for malignant neoplasm of breast: Secondary | ICD-10-CM | POA: Diagnosis not present

## 2023-07-22 ENCOUNTER — Encounter

## 2023-07-26 ENCOUNTER — Ambulatory Visit: Payer: Self-pay | Admitting: Family

## 2023-07-27 DIAGNOSIS — H20011 Primary iridocyclitis, right eye: Secondary | ICD-10-CM | POA: Diagnosis not present

## 2023-07-30 DIAGNOSIS — H20011 Primary iridocyclitis, right eye: Secondary | ICD-10-CM | POA: Diagnosis not present

## 2023-08-13 DIAGNOSIS — H20011 Primary iridocyclitis, right eye: Secondary | ICD-10-CM | POA: Diagnosis not present

## 2023-10-23 IMAGING — MG MM DIGITAL SCREENING BILAT W/ TOMO AND CAD
8 series · 8 of 24 positions shown · non-contrast
Comparison: None.

CLINICAL DATA: Screening.

EXAM:
DIGITAL SCREENING BILATERAL MAMMOGRAM WITH TOMOSYNTHESIS AND CAD
TECHNIQUE: Bilateral screening digital craniocaudal and mediolateral oblique
mammograms were obtained. Bilateral screening digital breast
tomosynthesis was performed. The images were evaluated with
computer-aided detection.

[L CC synth-2D]
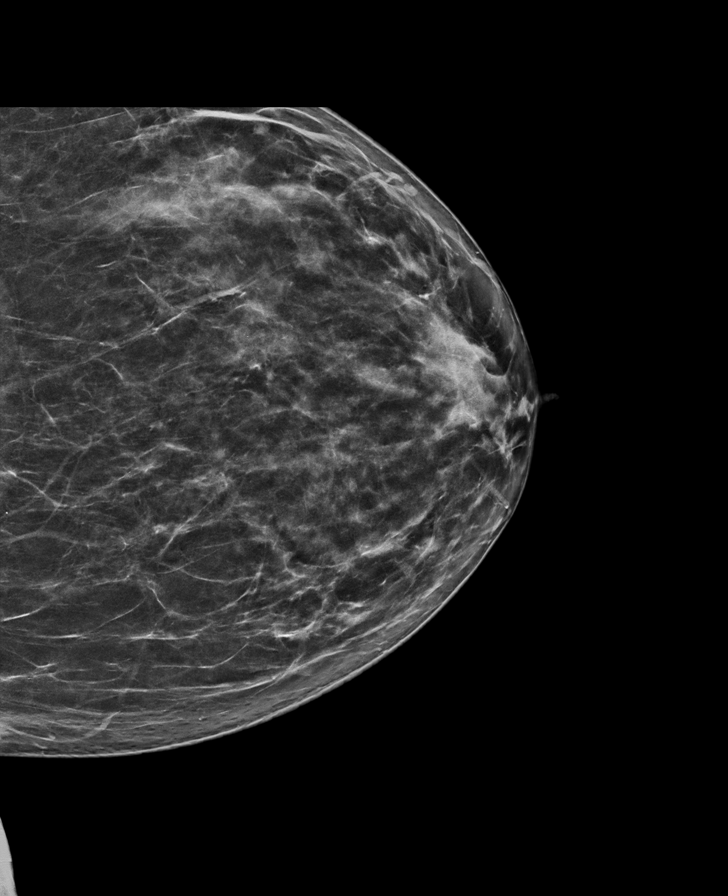

[R CC synth-2D]
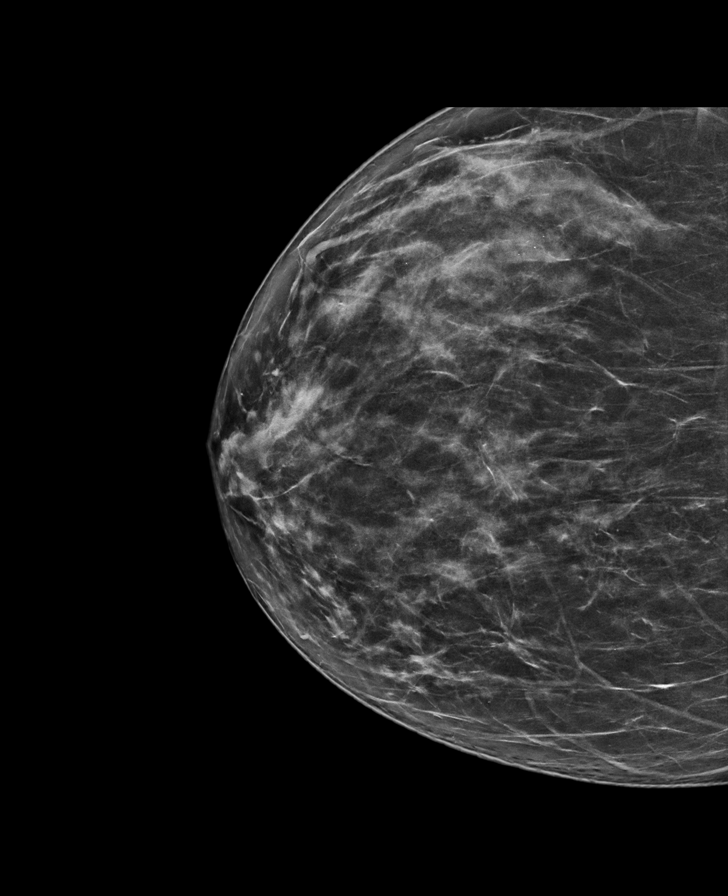

[R MLO synth-2D]
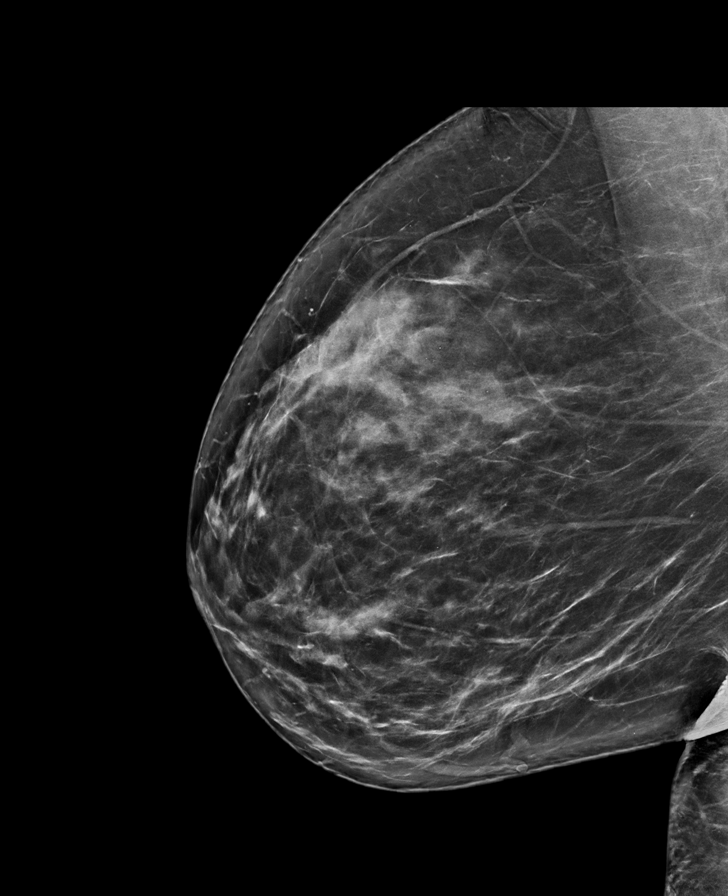

[L MLO synth-2D]
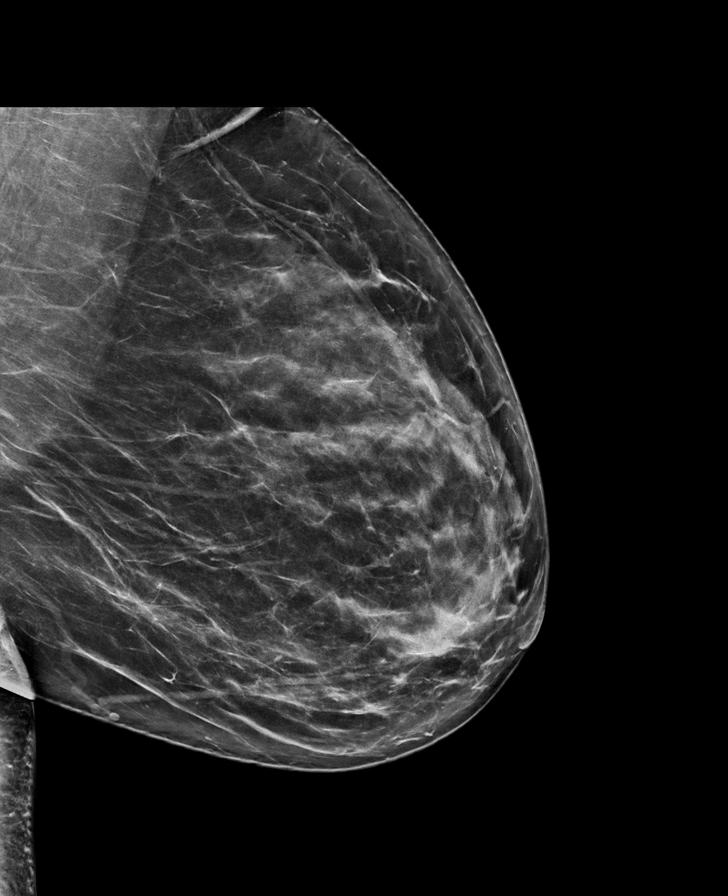

[L MLO tomo · tomo slice 45/88.0]
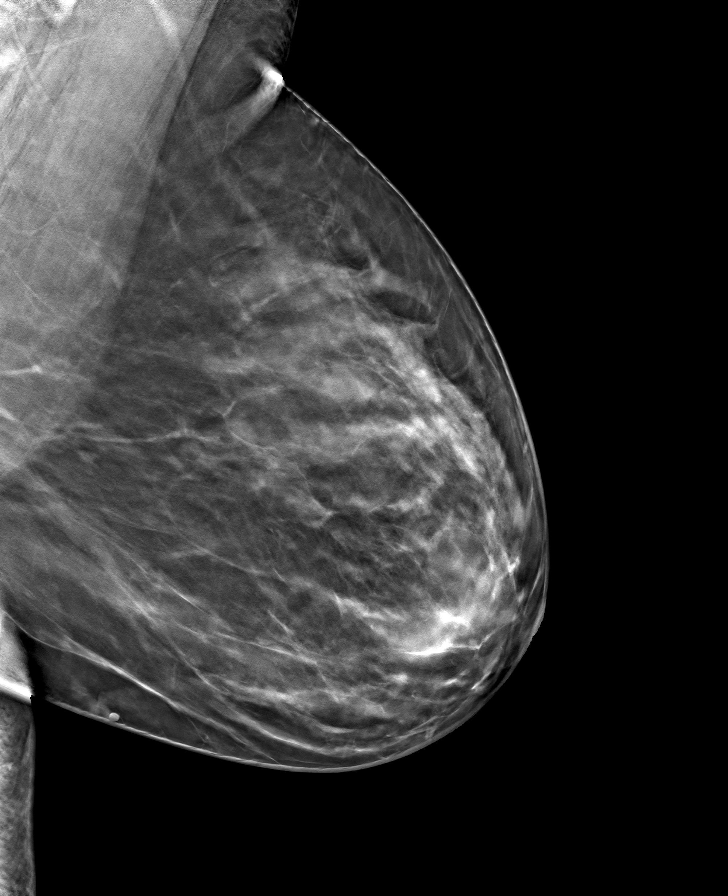

[R CC tomo · tomo slice 39/78.0]
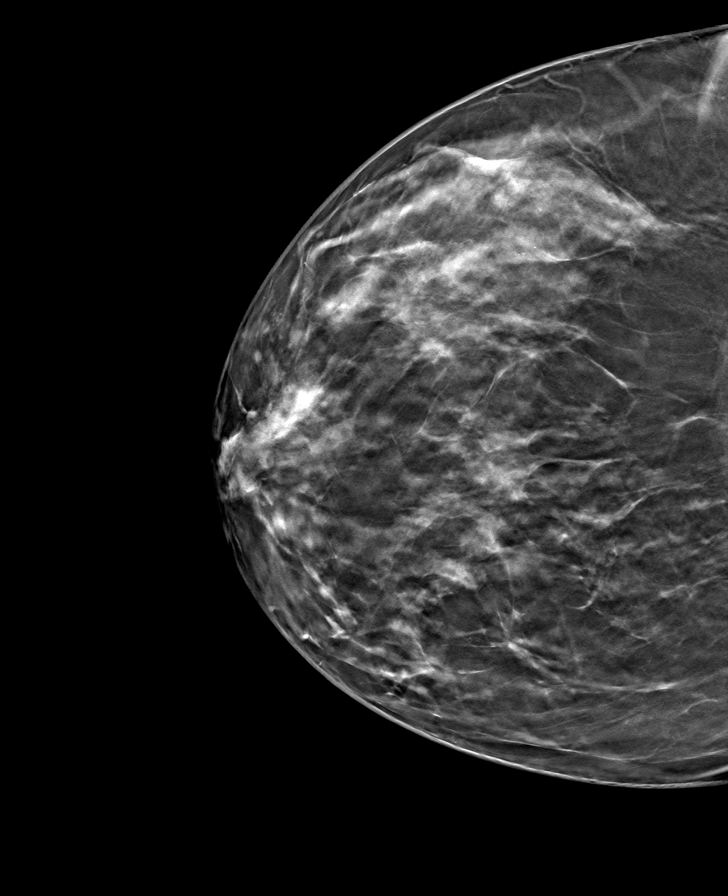

[L CC tomo · tomo slice 39/77.0]
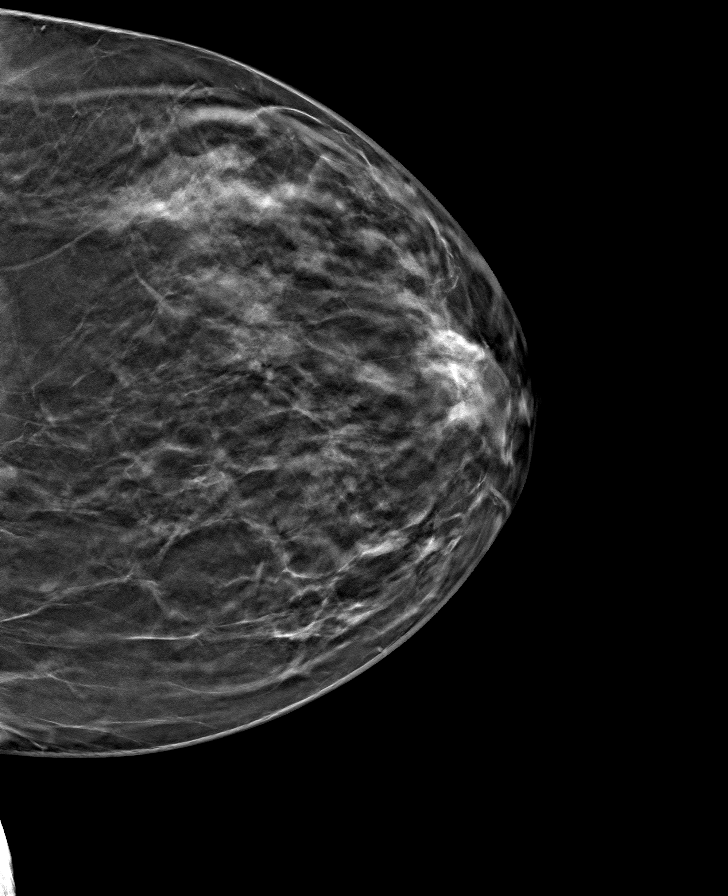

[R MLO tomo · tomo slice 45/88.0]
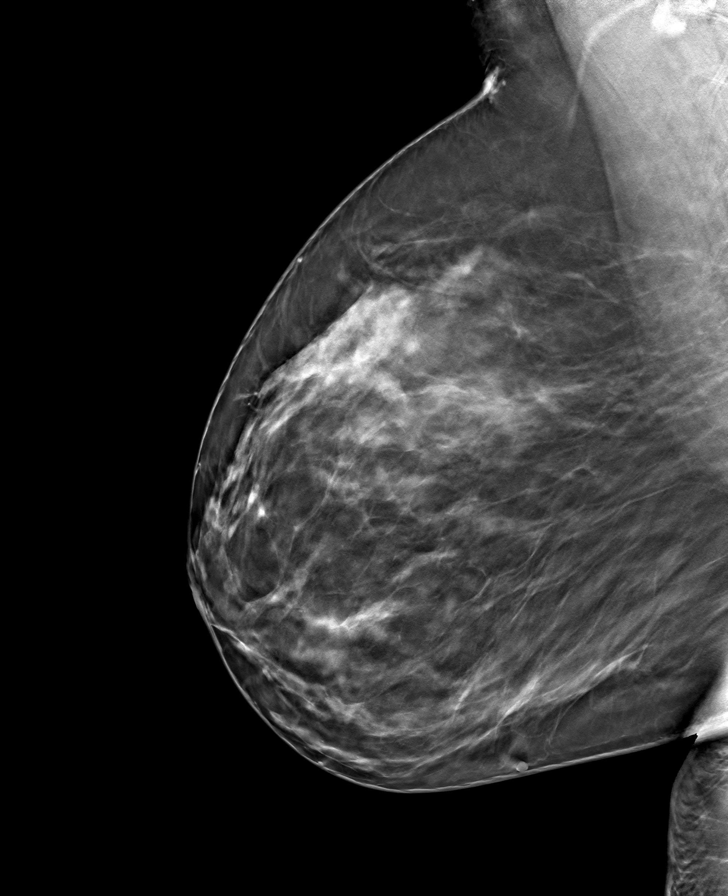

[8 of 24 positions shown; findings below may reference images not displayed]

ACR Breast Density Category b: There are scattered areas of
fibroglandular density.
FINDINGS: In the right breast, a possible asymmetry warrants further
evaluation. In the left breast, no findings suspicious for
malignancy.
IMPRESSION: Further evaluation is suggested for possible asymmetry in the right
breast.

RECOMMENDATION:
Diagnostic mammogram and possibly ultrasound of the right breast.
(Code:[id number])

The patient will be contacted regarding the findings, and additional
imaging will be scheduled.

BI-RADS CATEGORY  0: Incomplete. Need additional imaging evaluation
and/or prior mammograms for comparison.

## 2023-11-05 DIAGNOSIS — E559 Vitamin D deficiency, unspecified: Secondary | ICD-10-CM | POA: Diagnosis not present

## 2023-11-05 DIAGNOSIS — Z0000 Encounter for general adult medical examination without abnormal findings: Secondary | ICD-10-CM | POA: Diagnosis not present

## 2023-11-05 DIAGNOSIS — Z13220 Encounter for screening for lipoid disorders: Secondary | ICD-10-CM | POA: Diagnosis not present

## 2023-11-05 DIAGNOSIS — Z131 Encounter for screening for diabetes mellitus: Secondary | ICD-10-CM | POA: Diagnosis not present

## 2023-11-16 DIAGNOSIS — M791 Myalgia, unspecified site: Secondary | ICD-10-CM | POA: Diagnosis not present

## 2024-02-16 DIAGNOSIS — R7689 Other specified abnormal immunological findings in serum: Secondary | ICD-10-CM | POA: Diagnosis not present
